# Patient Record
Sex: Female | Born: 1995 | Race: Black or African American | Hispanic: No | Marital: Single | Smoking: Current every day smoker
Health system: Southern US, Community
[De-identification: ages and names within clinical notes are randomized; demographics above are authoritative.]

## PROBLEM LIST (undated history)

## (undated) ENCOUNTER — Inpatient Hospital Stay (HOSPITAL_COMMUNITY): Payer: Self-pay

## (undated) DIAGNOSIS — J45909 Unspecified asthma, uncomplicated: Secondary | ICD-10-CM

## (undated) DIAGNOSIS — F913 Oppositional defiant disorder: Secondary | ICD-10-CM

## (undated) DIAGNOSIS — H539 Unspecified visual disturbance: Secondary | ICD-10-CM

## (undated) DIAGNOSIS — F319 Bipolar disorder, unspecified: Secondary | ICD-10-CM

## (undated) DIAGNOSIS — F909 Attention-deficit hyperactivity disorder, unspecified type: Secondary | ICD-10-CM

## (undated) HISTORY — PX: NO PAST SURGERIES: SHX2092

## (undated) HISTORY — DX: Bipolar disorder, unspecified: F31.9

## (undated) HISTORY — DX: Attention-deficit hyperactivity disorder, unspecified type: F90.9

## (undated) HISTORY — PX: WRIST SURGERY: SHX841

## (undated) HISTORY — DX: Oppositional defiant disorder: F91.3

---
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
## 2016-01-29 ENCOUNTER — Encounter: Payer: Self-pay | Admitting: Certified Nurse Midwife

## 2016-01-29 ENCOUNTER — Ambulatory Visit (INDEPENDENT_AMBULATORY_CARE_PROVIDER_SITE_OTHER): Payer: Medicaid Other | Admitting: Certified Nurse Midwife

## 2016-01-29 ENCOUNTER — Encounter: Payer: Self-pay | Admitting: *Deleted

## 2016-01-29 VITALS — BP 115/73 | HR 86

## 2016-01-29 DIAGNOSIS — Z202 Contact with and (suspected) exposure to infections with a predominantly sexual mode of transmission: Secondary | ICD-10-CM | POA: Diagnosis not present

## 2016-01-29 DIAGNOSIS — Z7251 High risk heterosexual behavior: Secondary | ICD-10-CM

## 2016-01-29 DIAGNOSIS — Z113 Encounter for screening for infections with a predominantly sexual mode of transmission: Secondary | ICD-10-CM

## 2016-01-29 MED ORDER — AZITHROMYCIN 250 MG PO TABS: ORAL_TABLET | ORAL | Status: DC

## 2016-01-29 NOTE — Progress Notes (Signed)
Patient ID: Andrea Whitehead, female   DOB: 08-13-1996, 20 y.o.   MRN: [id number]   Chief Complaint  Patient presents with  . Exposure to STD    pt partner recent +CH, pt would like testing.    HPI Andrea Whitehead is a 20 y.o. female.  Here for STD testing.  States that her partner was notified 2 days ago of +CH.  Denies any vaginal discharge/pain/spotting, or urinary symptoms.   Had [facility]on [facility] the health [facility].    HPI  Past Medical History  Diagnosis Date  . Oppositional defiant disorder   . Bipolar disorder (HCC)   . ADHD (attention deficit hyperactivity disorder)   . Asthma   . Vision abnormalities     Past Surgical History  Procedure Laterality Date  . No past surgeries      Family History  Problem Relation Age of Onset  . Hypertension Father   . ADD / ADHD Mother   . Bipolar disorder Mother   . ADD / ADHD Brother   . Bipolar disorder Maternal Grandmother   . Diabetes Maternal Aunt     Social History Social History  Substance Use Topics  . Smoking status: Current Every Day Smoker -- 0.50 packs/day    Types: Cigarettes  . Smokeless tobacco: Never Used  . Alcohol Use: No    No Known Allergies  Current Outpatient Prescriptions  Medication Sig Dispense Refill  . azithromycin (ZITHROMAX) 250 MG tablet Take 4 tablets all together now. 4 tablet 0  . etonogestrel (NEXPLANON) 68 MG IMPL implant 1 each by Subdermal route once.     No current facility-administered medications for this visit.    Review of Systems Review of Systems Constitutional: negative for fatigue and weight loss Respiratory: negative for cough and wheezing Cardiovascular: negative for chest pain, fatigue and palpitations Gastrointestinal: negative for abdominal pain and change in bowel habits Genitourinary:negative Integument/breast: negative for nipple discharge Musculoskeletal:negative for myalgias Neurological: negative for gait problems and tremors Behavioral/Psych: negative for  abusive relationship, depression Endocrine: negative for temperature intolerance     Blood pressure 115/73, pulse 86, not currently breastfeeding.  Physical Exam Physical Exam General:   alert  Skin:   no rash or abnormalities  Lungs:   clear to auscultation bilaterally  Heart:   regular rate and rhythm, S1, S2 normal, no murmur, click, rub or gallop  Breasts:   deferred  Abdomen:  normal findings: no organomegaly, soft, non-tender and no hernia  Pelvis:  External genitalia: normal general appearance Urinary system: urethral meatus normal and bladder without fullness, nontender Vaginal: normal without tenderness, induration or masses Cervix: no CMT Adnexa: normal bimanual exam Uterus: anteverted and non-tender, normal size    50% of 15 min visit spent on counseling and coordination of care.   Data Reviewed Previous medical hx, meds, labs  Assessment     Recent exposure to chlamydia STD testing  High risk sexual behaviors    Plan    Orders Placed This Encounter  Procedures  . NuSwab Vaginitis Plus (VG+)  . HIV antibody (with reflex)  . Hepatitis B surface antigen  . RPR  . Hepatitis C antibody   Meds ordered this encounter  Medications  . azithromycin (ZITHROMAX) 250 MG tablet    Sig: Take 4 tablets all together now.    Dispense:  4 tablet    Refill:  0    Need to obtain previous records for nexplanon placement Follow up as needed or for annual exam.

## 2016-01-30 LAB — HEPATITIS B SURFACE ANTIGEN: Hepatitis B Surface Ag: NEGATIVE

## 2016-01-30 LAB — HIV ANTIBODY (ROUTINE TESTING W REFLEX): HIV SCREEN 4TH GENERATION: NONREACTIVE

## 2016-01-30 LAB — RPR: RPR: NONREACTIVE

## 2016-01-30 LAB — HEPATITIS C ANTIBODY

## 2016-01-31 ENCOUNTER — Other Ambulatory Visit: Payer: Self-pay | Admitting: Certified Nurse Midwife

## 2016-02-01 LAB — NUSWAB VAGINITIS PLUS (VG+)
CHLAMYDIA TRACHOMATIS, NAA: POSITIVE — AB
Candida albicans, NAA: NEGATIVE
Candida glabrata, NAA: NEGATIVE
NEISSERIA GONORRHOEAE, NAA: NEGATIVE
Trich vag by NAA: NEGATIVE

## 2016-02-06 ENCOUNTER — Ambulatory Visit: Payer: Medicaid Other | Admitting: Certified Nurse Midwife

## 2016-02-14 ENCOUNTER — Other Ambulatory Visit: Payer: Self-pay | Admitting: Certified Nurse Midwife

## 2016-02-14 ENCOUNTER — Ambulatory Visit: Payer: Medicaid Other

## 2016-05-06 ENCOUNTER — Ambulatory Visit (INDEPENDENT_AMBULATORY_CARE_PROVIDER_SITE_OTHER): Payer: Self-pay | Admitting: General Practice

## 2016-05-06 DIAGNOSIS — R110 Nausea: Secondary | ICD-10-CM

## 2016-05-06 DIAGNOSIS — Z3202 Encounter for pregnancy test, result negative: Secondary | ICD-10-CM

## 2016-05-06 DIAGNOSIS — R112 Nausea with vomiting, unspecified: Secondary | ICD-10-CM

## 2016-05-06 LAB — POCT PREGNANCY, URINE: Preg Test, Ur: NEGATIVE

## 2016-05-06 NOTE — Progress Notes (Signed)
Patient here for upt today. upt negative. Denies positive home test. Patient states she has had abdominal pain and has been nauseous and throwing up for a couple weeks now. Recommended to patient she contact her PCP. Patient verbalized understanding and wanted to know if we could take her nexplanon out. Told patient we could but she would need to make an appt for that. Patient verbalized understanding & had no questions

## 2016-06-03 ENCOUNTER — Encounter: Payer: Self-pay | Admitting: *Deleted

## 2016-07-01 ENCOUNTER — Encounter (HOSPITAL_COMMUNITY): Payer: Self-pay | Admitting: *Deleted

## 2016-07-01 ENCOUNTER — Emergency Department (HOSPITAL_COMMUNITY): Payer: Medicaid Other

## 2016-07-01 ENCOUNTER — Emergency Department (HOSPITAL_COMMUNITY)
Admission: EM | Admit: 2016-07-01 | Discharge: 2016-07-01 | Disposition: A | Discharge status: Home / Self Care | Payer: Medicaid Other | Attending: Emergency Medicine | Admitting: Emergency Medicine

## 2016-07-01 DIAGNOSIS — Z23 Encounter for immunization: Secondary | ICD-10-CM | POA: Diagnosis not present

## 2016-07-01 DIAGNOSIS — J45909 Unspecified asthma, uncomplicated: Secondary | ICD-10-CM | POA: Insufficient documentation

## 2016-07-01 DIAGNOSIS — F17210 Nicotine dependence, cigarettes, uncomplicated: Secondary | ICD-10-CM | POA: Diagnosis not present

## 2016-07-01 DIAGNOSIS — T148XXA Other injury of unspecified body region, initial encounter: Secondary | ICD-10-CM

## 2016-07-01 DIAGNOSIS — Y999 Unspecified external cause status: Secondary | ICD-10-CM | POA: Diagnosis not present

## 2016-07-01 DIAGNOSIS — M25512 Pain in left shoulder: Secondary | ICD-10-CM

## 2016-07-01 DIAGNOSIS — S40212A Abrasion of left shoulder, initial encounter: Secondary | ICD-10-CM | POA: Diagnosis not present

## 2016-07-01 DIAGNOSIS — S4992XA Unspecified injury of left shoulder and upper arm, initial encounter: Secondary | ICD-10-CM | POA: Diagnosis present

## 2016-07-01 DIAGNOSIS — Y939 Activity, unspecified: Secondary | ICD-10-CM | POA: Insufficient documentation

## 2016-07-01 DIAGNOSIS — Y92410 Unspecified street and highway as the place of occurrence of the external cause: Secondary | ICD-10-CM | POA: Diagnosis not present

## 2016-07-01 MED ORDER — OXYCODONE-ACETAMINOPHEN 5-325 MG PO TABS
ORAL_TABLET | ORAL | Status: AC
  Filled 2016-07-01: qty 1

## 2016-07-01 MED ORDER — OXYCODONE-ACETAMINOPHEN 5-325 MG PO TABS
1.0000 | ORAL_TABLET | ORAL | Status: DC | PRN
  Administered 2016-07-01: 1 via ORAL

## 2016-07-01 MED ORDER — TETANUS-DIPHTH-ACELL PERTUSSIS 5-2.5-18.5 LF-MCG/0.5 IM SUSP
0.5000 mL | Freq: Once | INTRAMUSCULAR | Status: AC
  Administered 2016-07-01: 0.5 mL via INTRAMUSCULAR
  Filled 2016-07-01: qty 0.5

## 2016-07-01 NOTE — ED Notes (Signed)
Given e-visit coupon
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
## 2016-07-01 NOTE — Discharge Instructions (Signed)
Wear shoulder sling for no more than 3 days, then begin performing gentle range of motion exercises. Ice your shoulder throughout the day, using an ice pack for 20 minutes at a time every hour. Alternate between tylenol and motrin for pain relief. Call orthopedic follow up today or tomorrow to schedule followup appointment for recheck of ongoing shoulder pain in 1-2 weeks that can be canceled with a 24-48 hour notice if complete resolution of pain. Return to the ER for changes or worsening symptoms.
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
## 2016-07-06 IMAGING — US US OB COMP +14 WK
2 series · 12 of 28 positions shown · non-contrast
Comparison: none

[Series 1: us ob comp +14 wk · 11 of 72 slices shown (1 of 2)]
[im 3/72]
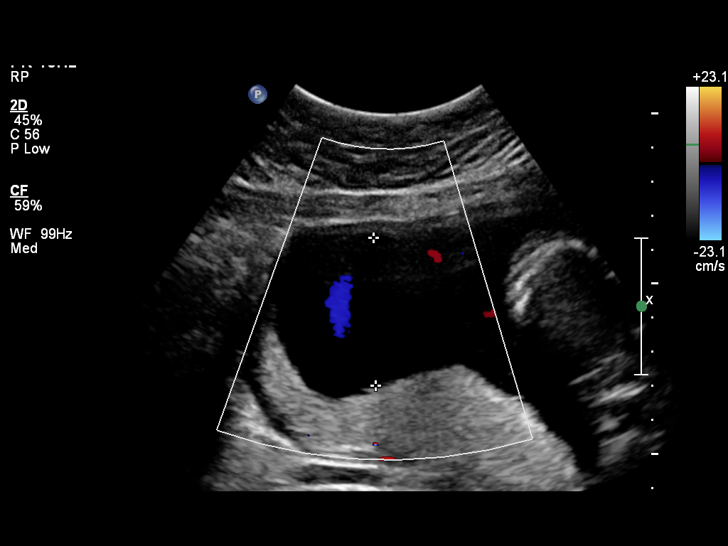
[im 9/72]
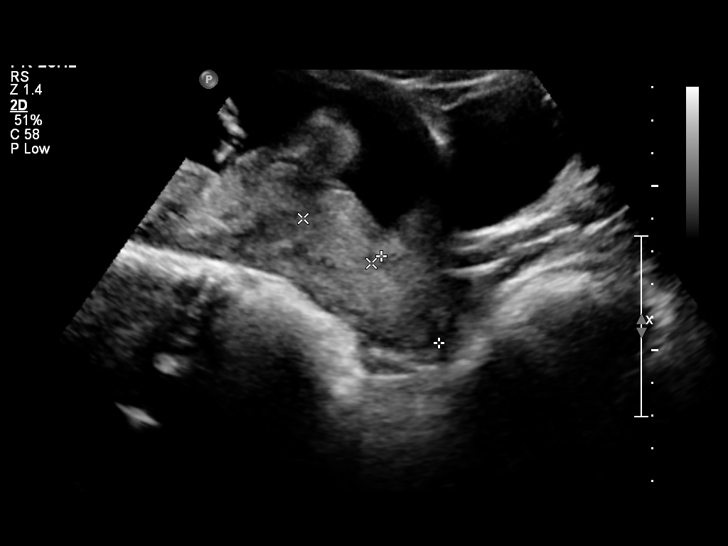
[im 15/72]
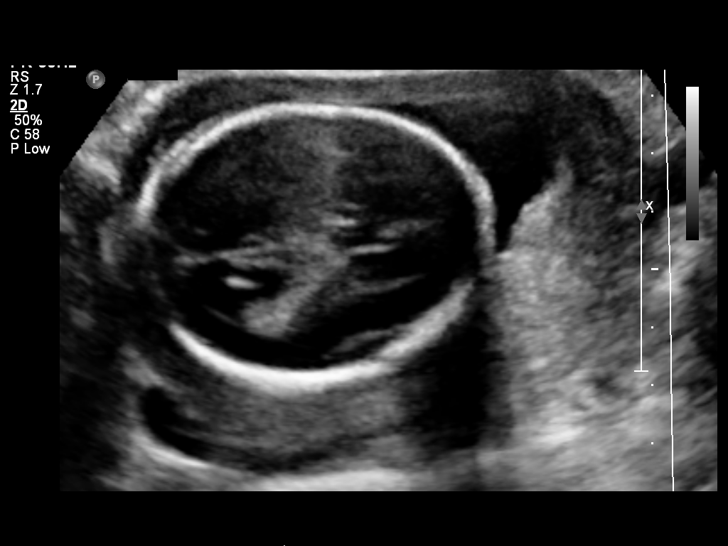
[im 23/72]
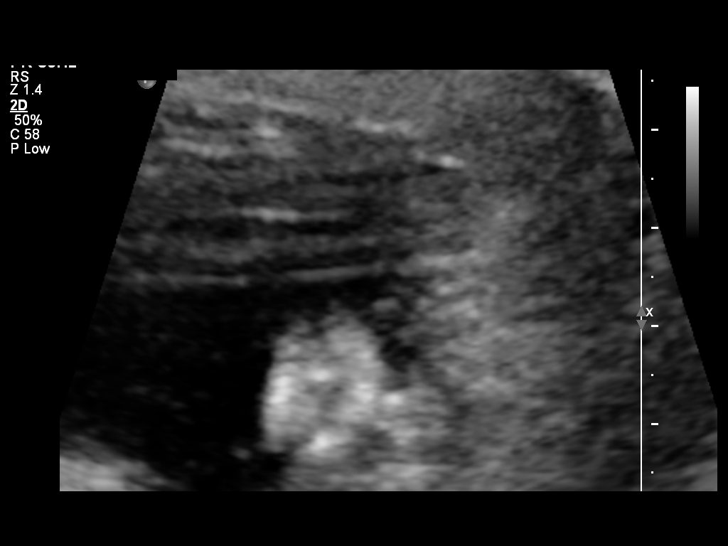
[im 29/72]
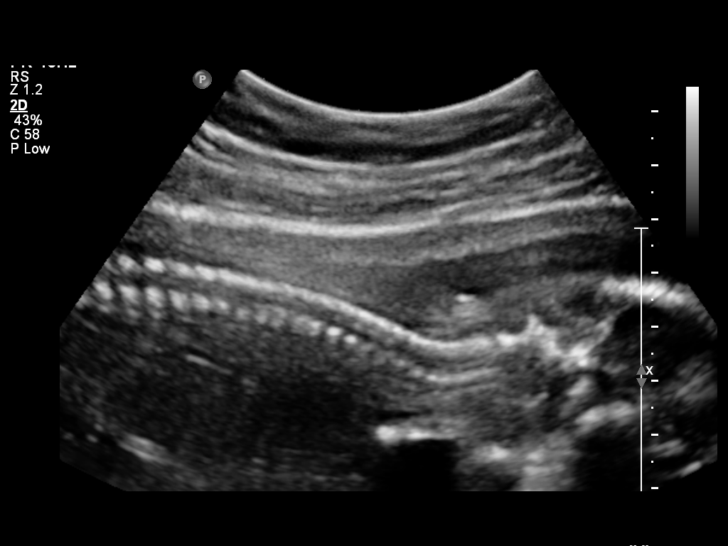
[im 35/72]
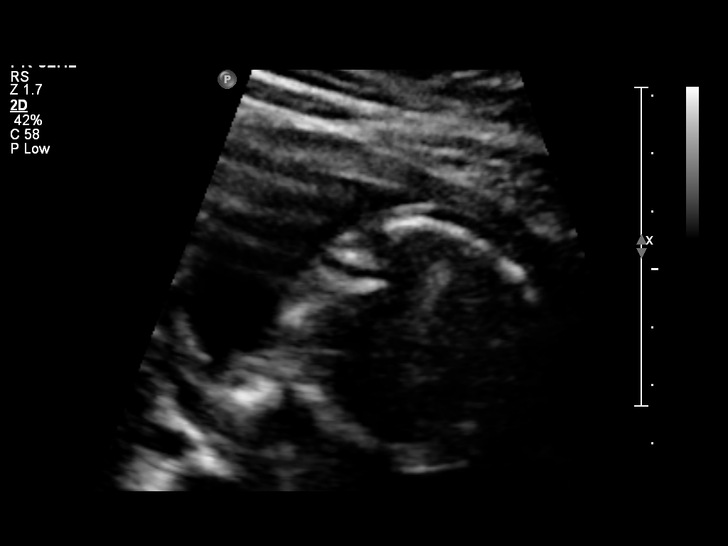
[im 43/72]
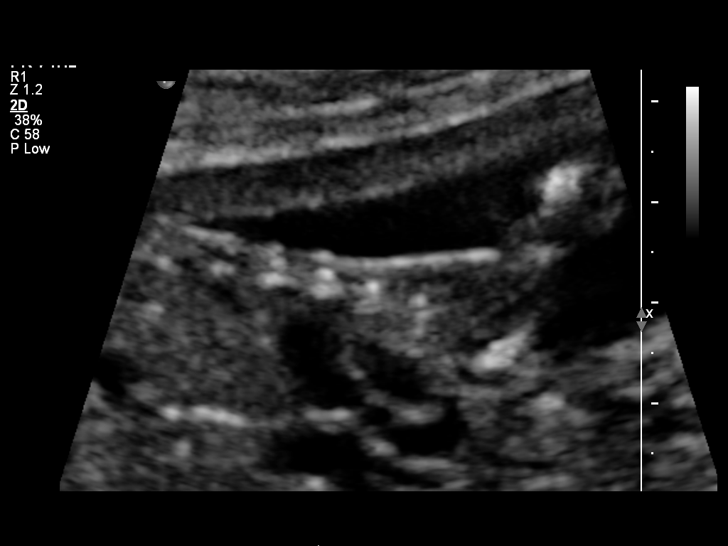
[im 49/72]
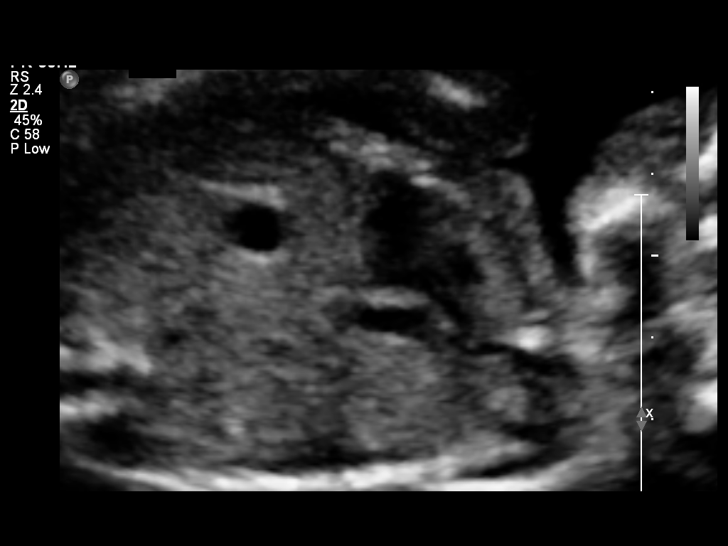
[im 54/72]
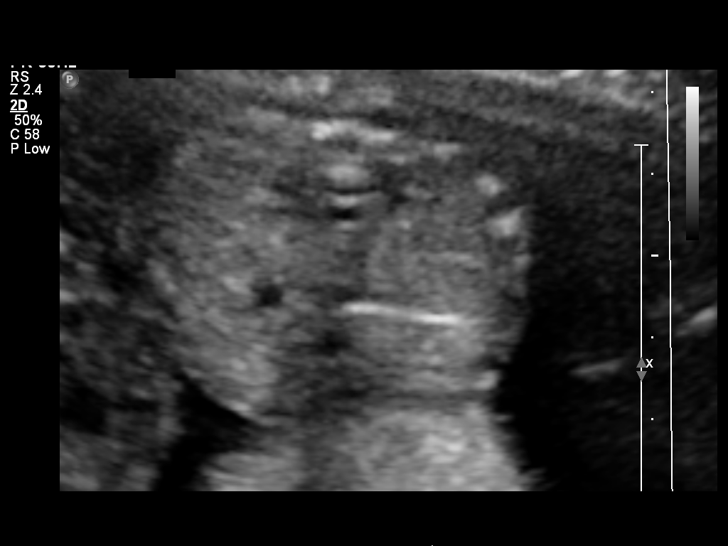
[im 63/72]
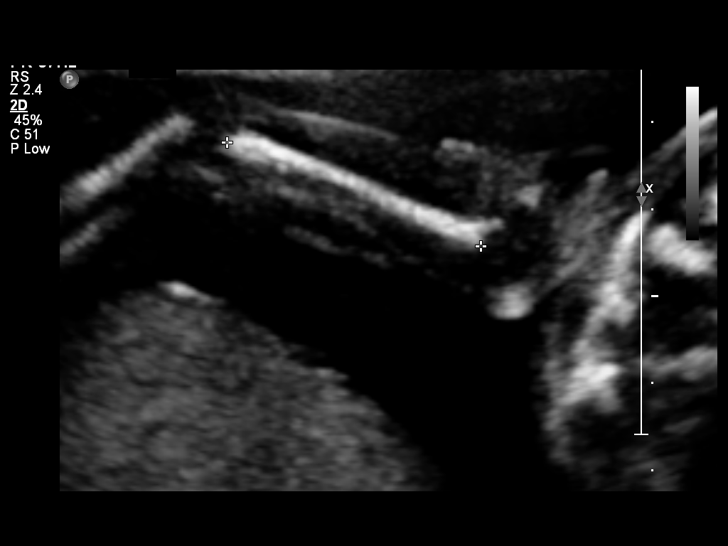
[im 69/72]
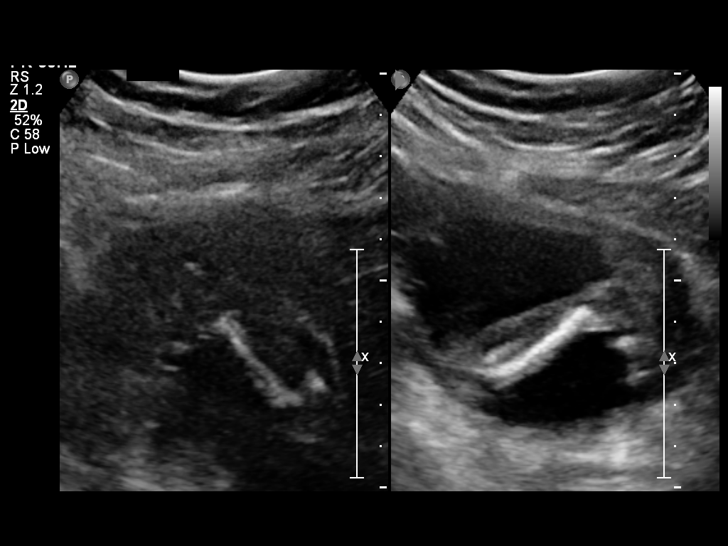

[Series 1: us ob comp +14 wk · 1 of 7 slices shown (2 of 2)]
[im 1/7]
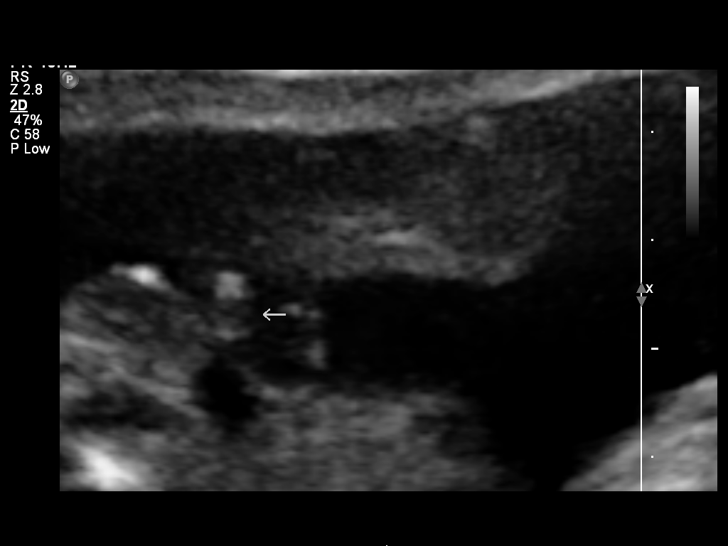

[12 of 28 positions shown; findings below may reference images not displayed]

OBSTETRICS REPORT
                      (Signed Final 05/21/2014 [DATE])

Service(s) Provided

 US OB COMP + 14 WK                                    76805.1
Indications

 Basic anatomic survey
Fetal Evaluation

 Num Of Fetuses:    1
 Fetal Heart Rate:  144                          bpm
 Cardiac Activity:  Observed
 Presentation:      Cephalic
 Placenta:          Posterior, above cervical
                    os
 P. Cord            Visualized, central
 Insertion:

 Amniotic Fluid
 AFI FV:      Subjectively within normal limits
                                             Larg Pckt:     4.3  cm
Biometry

 BPD:     45.6  mm     G. Age:  19w 5d                CI:        72.46   70 - 86
                                                      FL/HC:      18.3   16.8 -

 HC:     170.4  mm     G. Age:  19w 4d       46  %    HC/AC:      1.15   1.09 -

 AC:     147.8  mm     G. Age:  20w 1d       61  %    FL/BPD:
 FL:      31.1  mm     G. Age:  19w 4d       46  %    FL/AC:      21.0   20 - 24
 HUM:     31.9  mm     G. Age:  20w 5d       81  %
 CER:     19.3  mm     G. Age:  18w 5d       26  %
 NFT:     4.24  mm

 Est. FW:     316  gm    0 lb 11 oz      52  %
Gestational Age

 U/S Today:     19w 5d                                        EDD:   10/10/14
 Best:          19w 4d     Det. By:  Early Ultrasound         EDD:   10/11/14
Anatomy

 Cranium:          Appears normal         Aortic Arch:      Appears normal
 Fetal Cavum:      Appears normal         Ductal Arch:      Appears normal
 Ventricles:       Appears normal         Diaphragm:        Appears normal
 Choroid Plexus:   Appears normal         Stomach:          Appears normal, left
                                                            sided
 Cerebellum:       Appears normal         Abdomen:          Appears normal
 Posterior Fossa:  Appears normal         Abdominal Wall:   Appears nml (cord
                                                            insert, abd wall)
 Nuchal Fold:      Appears normal         Cord Vessels:     Appears normal (3
                                                            vessel cord)
 Face:             Appears normal         Kidneys:          Appear normal
                   (orbits and profile)
 Lips:             Appears normal         Bladder:          Appears normal
 Heart:            Appears normal         Spine:            Appears normal
                   (4CH, axis, and
                   situs)
 RVOT:             Appears normal         Lower             Appears normal
                                          Extremities:
 LVOT:             Appears normal         Upper             Appears normal
                                          Extremities:

 Other:  Heels visualized. Nasal bone visualized. Technically difficult due to
         fetal position.
Targeted Anatomy

 Fetal Central Nervous System
 Lat. Ventricles:  7.7                    Cisterna Magna:
Cervix Uterus Adnexa

 Cervical Length:    3.19     cm

 Cervix:       Normal appearance by transabdominal scan.
 Left Ovary:    Within normal limits.
 Right Ovary:   Within normal limits.

 Adnexa:     No abnormality visualized.
Impression

 Single IUP at 19w 4d
 Normal fetal anatomic survey
 No markers associated with aneuploidy noted
 Posterior placenta without previa
 Normal amniotic fluid volume
Recommendations

 Follow-up ultrasounds as clinically indicated.

 questions or concerns.

## 2016-10-15 ENCOUNTER — Emergency Department (HOSPITAL_COMMUNITY)
Admission: EM | Admit: 2016-10-15 | Discharge: 2016-10-15 | Disposition: A | Discharge status: Home / Self Care | Payer: Medicaid Other | Attending: Emergency Medicine | Admitting: Emergency Medicine

## 2016-10-15 ENCOUNTER — Emergency Department (HOSPITAL_COMMUNITY): Payer: Medicaid Other

## 2016-10-15 DIAGNOSIS — R69 Illness, unspecified: Secondary | ICD-10-CM

## 2016-10-15 DIAGNOSIS — R200 Anesthesia of skin: Secondary | ICD-10-CM

## 2016-10-15 DIAGNOSIS — J45909 Unspecified asthma, uncomplicated: Secondary | ICD-10-CM | POA: Insufficient documentation

## 2016-10-15 DIAGNOSIS — F17210 Nicotine dependence, cigarettes, uncomplicated: Secondary | ICD-10-CM | POA: Insufficient documentation

## 2016-10-15 DIAGNOSIS — F909 Attention-deficit hyperactivity disorder, unspecified type: Secondary | ICD-10-CM | POA: Insufficient documentation

## 2016-10-15 DIAGNOSIS — R05 Cough: Secondary | ICD-10-CM | POA: Diagnosis present

## 2016-10-15 DIAGNOSIS — J111 Influenza due to unidentified influenza virus with other respiratory manifestations: Secondary | ICD-10-CM

## 2016-10-15 LAB — RAPID STREP SCREEN (MED CTR MEBANE ONLY): Streptococcus, Group A Screen (Direct): NEGATIVE

## 2016-10-15 LAB — INFLUENZA PANEL BY PCR (TYPE A & B)
INFLAPCR: POSITIVE — AB
INFLBPCR: NEGATIVE

## 2016-10-15 MED ORDER — IPRATROPIUM-ALBUTEROL 0.5-2.5 (3) MG/3ML IN SOLN
3.0000 mL | Freq: Once | RESPIRATORY_TRACT | Status: AC
  Administered 2016-10-15: 3 mL via RESPIRATORY_TRACT
  Filled 2016-10-15: qty 3

## 2016-10-15 MED ORDER — IBUPROFEN 800 MG PO TABS
800.0000 mg | ORAL_TABLET | Freq: Once | ORAL | Status: AC
  Administered 2016-10-15: 800 mg via ORAL
  Filled 2016-10-15: qty 1

## 2016-10-15 NOTE — ED Notes (Signed)
Pt not in room or bathroom. Pt seen walking out front doors to lobby.

## 2016-10-15 NOTE — ED Triage Notes (Signed)
Per EMS. Pt reports productive cough, generalized body aches, and fever of 102F for the past day. Has not taken any OTC medications.

## 2016-10-15 NOTE — ED Provider Notes (Signed)
[facility]-EMERGENCY DEPT Provider Note   CSN: [id number] Arrival date & time: 10/15/16  1130  By signing my name below, I, Majel Homer, attest that this documentation has been prepared under the direction and in the presence of [facility], PA-C. Electronically Signed: Majel Homer, Scribe. 10/15/2016. 11:53 AM.  History   Chief Complaint Chief Complaint  Patient presents with  . Cough  . Generalized Body Aches   The history is provided by the patient. No language interpreter was used.   HPI Comments: Andrea Whitehead is a 21 y.o. female with PMHx of asthma, brought in by EMS to the Emergency Department complaining of gradually worsening, fever, generalized body aches, and productive cough that began yesterday.  Mild SOB, nasal congestion.  Pt reports intermittent, episodes of bilateral leg numbness that also began yesterday in which she "cannot not feel her legs." She states she awoke from sleep this morning with a similar sensation and notes she had 1 episode of bowel incontinence in bed, diarrhea in nature. She reports these episodes last for ~30 minutes before spontaneously improving. Pt states associated intermittent shortness of breath, sore throat, "hot flashes," nausea, vomiting secondary to her cough, and diarrhea. She notes she has been around her boyfriend and son who recently had a "cold." She states she has taken NyQuil for her symptoms with no relief and reports she did not receive the influenza vaccination this year. Pt denies ear pain, dysuria, urinary frequency, back pain, recent long travel or hospitalization, IV drug use, and possibility of being pregnant. She states she is currently taking Nexplanon contraception which "stops her period."   Past Medical History:  Diagnosis Date  . ADHD (attention deficit hyperactivity disorder)   . Asthma   . Bipolar disorder (HCC)   . Oppositional defiant disorder   . Vision abnormalities     Patient Active Problem List   Diagnosis Date Noted  .  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 Episodic mood disorder (HCC) 12/07/2012  . ODD (oppositional defiant disorder) 12/07/2012  . ADHD (attention deficit hyperactivity disorder) 05/27/2012    Past Surgical History:  Procedure Laterality Date  . NO PAST SURGERIES      OB History    Gravida Para Term Preterm AB Living   1 1 1     1    SAB TAB Ectopic Multiple Live Births         0 1     Home Medications    Prior to Admission medications   Medication Sig Start Date End Date Taking? Authorizing Provider  etonogestrel (NEXPLANON) 68 MG IMPL implant 1 each by Subdermal route once.   Yes Historical Provider, MD  azithromycin (ZITHROMAX) 250 MG tablet Take 4 tablets all together now. Patient not taking: Reported on 10/15/2016 01/29/16   Roe Coombs, CNM    Family History Family History  Problem Relation Age of Onset  . Hypertension Father   . ADD / ADHD Mother   . Bipolar disorder Mother   . ADD / ADHD Brother   . Bipolar disorder Maternal Grandmother   . Diabetes Maternal Aunt     Social History Social History  Substance Use Topics  . Smoking status: Current Every Day Smoker    Packs/day: 0.50    Types: Cigarettes  . Smokeless tobacco: Never Used  . Alcohol use No     Allergies   Patient has no known allergies.   Review of Systems Review of Systems  HENT: Positive for sore  throat. Negative for ear pain.   Respiratory: Positive for cough and shortness of breath.   Gastrointestinal: Positive for diarrhea, nausea and vomiting.  Genitourinary: Negative for dysuria and frequency.  Musculoskeletal: Positive for myalgias. Negative for back pain.  Neurological: Positive for numbness.   Physical Exam Updated Vital Signs BP 118/75 (BP Location: Right Arm)   Pulse 118   Temp 102.7 F (39.3 C) (Oral)   Resp 20   SpO2 100%   Physical Exam  Constitutional: She appears well-developed and well-nourished. No distress.  HENT:    Head: Normocephalic and atraumatic.  Mouth/Throat: Oropharynx is clear and moist. No oropharyngeal exudate.  Eyes: Conjunctivae are normal.  Neck: Neck supple.  Cardiovascular: Normal rate and regular rhythm.   Pulmonary/Chest: Effort normal. No respiratory distress. She has no wheezes. She has no rales.  Coughing   Abdominal: Soft. She exhibits no distension and no mass. There is no tenderness. There is no rebound and no guarding.  Musculoskeletal:  Spine nontender, no crepitus, or stepoffs. Lower extremities:  Strength 5/5, sensation intact, distal pulses intact.     Neurological: She is alert.  Skin: She is not diaphoretic.  Nursing note and vitals reviewed.  ED Treatments / Results  Labs (all labs ordered are listed, but only abnormal results are displayed) Labs Reviewed  RAPID STREP SCREEN (NOT AT [facility])  CULTURE, GROUP A STREP [facility]  INFLUENZA PANEL BY PCR (TYPE A & B, H1N1)    EKG  EKG Interpretation None       Radiology Dg Chest 2 View  Result Date: 10/15/2016 CLINICAL DATA:  Cough, fever, body aches EXAM: CHEST  2 VIEW COMPARISON:  None. FINDINGS: No active infiltrate or effusion is seen. There is some peribronchial thickening which may indicate bronchitis. Mediastinal and hilar contours are unremarkable. The heart is within normal limits in size. No bony abnormality is seen. IMPRESSION: No pneumonia or effusion.  Cannot exclude bronchitis. Electronically Signed   By: Dwyane DeePaul  Barry M.D.   On: 10/15/2016 11:58    Procedures Procedures (including critical care time)  Medications Ordered in ED Medications  ibuprofen (ADVIL,MOTRIN) tablet 800 mg (800 mg Oral Given 10/15/16 1208)  ipratropium-albuterol (DUONEB) 0.5-2.5 (3) MG/3ML nebulizer solution 3 mL (3 mLs Nebulization Given 10/15/16 1321)    DIAGNOSTIC STUDIES:  Oxygen Saturation is 100% on RA, normal by my interpretation.    COORDINATION OF CARE:  11:45 AM Discussed treatment plan with pt at bedside and pt  agreed to plan.  Initial Impression / Assessment and Plan / ED Course  I have reviewed the triage vital signs and the nursing notes.  Pertinent labs & imaging results that were available during my care of the patient were reviewed by me and considered in my medical decision making (see chart for details).  Clinical Course    Febrile patient with cough and symptoms that are influenza-like.  She improved with motrin and nebs and felt better.  Oddly, pt c/o intermittent bilateral leg "numbness" in which she could not feel her legs.  This morning this occurred and she states this is why she did not realize she had diarrhea in the bed.  It occurred once while in the ED during which pt denies she could feel me touch her all the way up to her diaphragm  She could move her legs at the time and had 5/5 strength, and was complaining of needing to urinate and was wheeled to the bathroom.  The symptoms resolved again and she was able  to get up, chase her toddler around the halls, and exam was completely normal.  The etiology of this is unclear though she did tell me that the symptoms seemed to occur when her pain was worse.  She denies IVDU and denies any back pain.  Exam while not symptomatic was completely normal.  Unfortunately as I was treating and monitoring the patient and waiting for another episode to occur for further testing, she decided to leave the ED without telling staff.     Final Clinical Impressions(s) / ED Diagnoses   Final diagnoses:  Influenza-like illness  Lower extremity numbness    New Prescriptions Discharge Medication List as of 10/15/2016  2:04 PM       Trixie Dredge, PA-C 10/15/16 1541    Alvira Monday, MD 10/17/16 1202

## 2016-10-17 LAB — CULTURE, GROUP A STREP (THRC)

## 2016-10-28 ENCOUNTER — Inpatient Hospital Stay (HOSPITAL_COMMUNITY)
Admission: AD | Admit: 2016-10-28 | Discharge: 2016-10-28 | Disposition: A | Discharge status: Home / Self Care | Payer: Medicaid Other | Source: Ambulatory Visit | Attending: Obstetrics and Gynecology | Admitting: Obstetrics and Gynecology

## 2016-10-28 ENCOUNTER — Encounter (HOSPITAL_COMMUNITY): Payer: Self-pay | Admitting: *Deleted

## 2016-10-28 DIAGNOSIS — R1010 Upper abdominal pain, unspecified: Secondary | ICD-10-CM

## 2016-10-28 DIAGNOSIS — N644 Mastodynia: Secondary | ICD-10-CM | POA: Diagnosis not present

## 2016-10-28 DIAGNOSIS — R1030 Lower abdominal pain, unspecified: Secondary | ICD-10-CM | POA: Insufficient documentation

## 2016-10-28 DIAGNOSIS — Z3202 Encounter for pregnancy test, result negative: Secondary | ICD-10-CM | POA: Diagnosis not present

## 2016-10-28 DIAGNOSIS — Z975 Presence of (intrauterine) contraceptive device: Secondary | ICD-10-CM | POA: Diagnosis not present

## 2016-10-28 LAB — URINALYSIS, ROUTINE W REFLEX MICROSCOPIC
Bilirubin Urine: NEGATIVE
GLUCOSE, UA: NEGATIVE mg/dL
HGB URINE DIPSTICK: NEGATIVE
Ketones, ur: NEGATIVE mg/dL
Leukocytes, UA: NEGATIVE
Nitrite: NEGATIVE
PROTEIN: NEGATIVE mg/dL
Specific Gravity, Urine: 1.018 (ref 1.005–1.030)
pH: 6 (ref 5.0–8.0)

## 2016-10-28 LAB — POCT PREGNANCY, URINE: Preg Test, Ur: NEGATIVE

## 2016-10-28 MED ORDER — IBUPROFEN 600 MG PO TABS
600.0000 mg | ORAL_TABLET | Freq: Once | ORAL | Status: AC
  Administered 2016-10-28: 600 mg via ORAL
  Filled 2016-10-28: qty 1
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
## 2016-10-28 NOTE — MAU Note (Signed)
Pt left before signing.  

## 2016-10-28 NOTE — MAU Note (Signed)
Pt presents to MAU with complaints of lower back, lower abdominal pain and breast pain for a couple of days. Denies any vaginal bleeding or abnormal discharge.

## 2016-10-28 NOTE — MAU Provider Note (Signed)
S:  [name] is a 21 y.o. female G1P1001 presenting VIA EMS for pregnancy symptoms. She has the nexplanon in place. She is experiencing pregnancy symptoms such as lower abdominal cramping, and breast tenderness. She has not had a normal cycle in over 1 year.  She did not take a pregnancy test at home.   O:  GENERAL: Well-developed, well-nourished female in no acute distress.  LUNGS: Effort normal ABDOMEN: soft, non-tender  SKIN: Warm, dry and without erythema PSYCH: Normal mood and affect  Vitals:   10/28/16 1818  BP: 118/63  Pulse: 78  Resp: 18  Temp: 98.4 F (36.9 C)   MDM: Urine pregnancy test negative  UA pending Ibuprofen 600 mg PO  A:  1. Encounter for pregnancy test with result negative     P:  Patient walked out of MAU after Ibuprofen was given UA pending Unable to assess pain post ibuprofen. Discussed MAU use   Duane LopeJennifer I Sabiha Sura, NP 10/28/2016 6:40 PM

## 2018-08-25 ENCOUNTER — Emergency Department (HOSPITAL_COMMUNITY)
Admission: EM | Admit: 2018-08-25 | Discharge: 2018-08-25 | Disposition: A | Discharge status: Home / Self Care | Payer: Medicaid Other | Attending: Emergency Medicine | Admitting: Emergency Medicine

## 2018-08-25 ENCOUNTER — Encounter (HOSPITAL_COMMUNITY): Payer: Self-pay | Admitting: Emergency Medicine

## 2018-08-25 ENCOUNTER — Emergency Department (HOSPITAL_COMMUNITY): Payer: Medicaid Other

## 2018-08-25 DIAGNOSIS — Z5321 Procedure and treatment not carried out due to patient leaving prior to being seen by health care provider: Secondary | ICD-10-CM | POA: Insufficient documentation

## 2018-08-25 DIAGNOSIS — R0981 Nasal congestion: Secondary | ICD-10-CM | POA: Diagnosis not present

## 2018-08-25 NOTE — ED Triage Notes (Signed)
Pt reports cough/congestion X1 day. Pt states she has tried no OTC meds.

## 2018-08-25 NOTE — ED Notes (Signed)
Pt seen leaving the department shortly after being triaged.

## 2018-08-25 NOTE — ED Notes (Signed)
Pt called x3 to room in green zone no response.

## 2018-09-01 ENCOUNTER — Encounter (HOSPITAL_COMMUNITY): Payer: Self-pay | Admitting: Emergency Medicine

## 2018-09-01 ENCOUNTER — Emergency Department (HOSPITAL_COMMUNITY)
Admission: EM | Admit: 2018-09-01 | Discharge: 2018-09-01 | Disposition: A | Discharge status: Home / Self Care | Payer: Medicaid Other | Attending: Emergency Medicine | Admitting: Emergency Medicine

## 2018-09-01 DIAGNOSIS — J0100 Acute maxillary sinusitis, unspecified: Secondary | ICD-10-CM | POA: Diagnosis not present

## 2018-09-01 DIAGNOSIS — J45909 Unspecified asthma, uncomplicated: Secondary | ICD-10-CM | POA: Diagnosis not present

## 2018-09-01 DIAGNOSIS — F17210 Nicotine dependence, cigarettes, uncomplicated: Secondary | ICD-10-CM | POA: Insufficient documentation

## 2018-09-01 DIAGNOSIS — H9202 Otalgia, left ear: Secondary | ICD-10-CM

## 2018-09-01 DIAGNOSIS — F909 Attention-deficit hyperactivity disorder, unspecified type: Secondary | ICD-10-CM | POA: Diagnosis not present

## 2018-09-01 MED ORDER — FLUTICASONE PROPIONATE 50 MCG/ACT NA SUSP: 1.0000 | Freq: Every day | NASAL | 2 refills | Status: DC

## 2018-09-01 MED ORDER — AMOXICILLIN 500 MG PO CAPS
1000.0000 mg | ORAL_CAPSULE | Freq: Once | ORAL | Status: AC
  Administered 2018-09-01: 1000 mg via ORAL
  Filled 2018-09-01: qty 2

## 2018-09-01 MED ORDER — IBUPROFEN 600 MG PO TABS: 600.0000 mg | ORAL_TABLET | Freq: Four times a day (QID) | ORAL | 0 refills | Status: DC | PRN

## 2018-09-01 MED ORDER — IBUPROFEN 400 MG PO TABS
600.0000 mg | ORAL_TABLET | Freq: Once | ORAL | Status: AC
  Administered 2018-09-01: 600 mg via ORAL
  Filled 2018-09-01: qty 1

## 2018-09-01 MED ORDER — AMOXICILLIN 500 MG PO CAPS: 1000.0000 mg | ORAL_CAPSULE | Freq: Two times a day (BID) | ORAL | 0 refills | Status: DC

## 2018-09-01 NOTE — ED Triage Notes (Signed)
Pt presents to ER from home with [facility] for L ear ache for couple days; denies fever, inner ear red, tympanic membranes grey

## 2018-09-01 NOTE — ED Notes (Signed)
Pt asked this RN for a ride home, pt currently holding phone which she has been using to talk to people since shes been here; I told her to attempt to get a ride on her own; pt asked if one of the officers standing at the nurses station could give her a ride, they said her address was outside of their district; pt ambulatory to lobby with steady gait noted

## 2018-09-01 NOTE — ED Provider Notes (Signed)
[facility] EMERGENCY DEPARTMENT Provider Note   CSN: [id number] Arrival date & time: 09/01/18  0051     History   Chief Complaint Chief Complaint  Patient presents with  . Otalgia    HPI Andrea Whitehead is a 22 y.o. female.  Patient to ED via EMS for left ear pain that started several hours ago. She has not taken anything for the pain prior to arrival. No fever. She reports some nasal congestion for several weeks without fever. No sore throat, nausea, cough.  The history is provided by the patient. No language interpreter was used.  Otalgia  Pertinent negatives include no sore throat and no cough.    Past Medical History:  Diagnosis Date  . ADHD (attention deficit hyperactivity disorder)   . Asthma   . Bipolar disorder (HCC)   . Oppositional defiant disorder   . Vision abnormalities     Patient Active Problem List   Diagnosis Date Noted  . 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 Episodic mood disorder (HCC) 12/07/2012  . ODD (oppositional defiant disorder) 12/07/2012  . ADHD (attention deficit hyperactivity disorder) 05/27/2012    Past Surgical History:  Procedure Laterality Date  . NO PAST SURGERIES    . WRIST SURGERY Right      OB History    Gravida  1   Para  1   Term  1   Preterm      AB      Living  1     SAB      TAB      Ectopic      Multiple  0   Live Births  1            Home Medications    Prior to Admission medications   Medication Sig Start Date End Date Taking? Authorizing Provider  etonogestrel (NEXPLANON) 68 MG IMPL implant 1 each by Subdermal route once.    [provider]    Family History Family History  Problem Relation Age of Onset  . Hypertension Father   . ADD / ADHD Mother   . Bipolar disorder Mother   . ADD / ADHD Brother   . Bipolar disorder Maternal Grandmother   . Diabetes Maternal Aunt     Social History Social History   Tobacco  Use  . Smoking status: Current Every Day Smoker    Packs/day: 0.50    Types: Cigarettes  . Smokeless tobacco: Never Used  Substance Use Topics  . Alcohol use: No  . Drug use: No     Allergies   Patient has no known allergies.   Review of Systems Review of Systems  Constitutional: Negative for chills and fever.  HENT: Positive for congestion and ear pain. Negative for sore throat.   Eyes: Negative for pain and discharge.  Respiratory: Negative for cough.   Gastrointestinal: Negative for nausea.     Physical Exam Updated Vital Signs BP 132/77 (BP Location: Right Arm)   Pulse 75   Temp 98.1 F (36.7 C) (Oral)   Resp 18   Ht 5\' 5"  (1.651 m)   Wt 81.6 kg   LMP 08/13/2018   SpO2 98%   BMI 29.95 kg/m   Physical Exam  Constitutional: She appears well-developed and well-nourished.  HENT:  Head: Normocephalic.  Right Ear: Tympanic membrane normal.  Left Ear: Tympanic membrane is injected.  No middle ear effusion.  Nose: Mucosal edema present. Left sinus exhibits maxillary sinus  tenderness.  Mouth/Throat: Uvula is midline and oropharynx is clear and moist.  Eyes: Conjunctivae are normal.  Neck: Normal range of motion. Neck supple.  Cardiovascular: Normal rate and regular rhythm.  Pulmonary/Chest: Effort normal and breath sounds normal. She has no wheezes. She has no rales.  Abdominal: Soft. Bowel sounds are normal. There is no tenderness. There is no rebound and no guarding.  Musculoskeletal: Normal range of motion.  Lymphadenopathy:    She has no cervical adenopathy.  Neurological: She is alert.  Skin: Skin is warm and dry. No rash noted.  Psychiatric: She has a normal mood and affect.     ED Treatments / Results  Labs (all labs ordered are listed, but only abnormal results are displayed) Labs Reviewed - No data to display  EKG None  Radiology No results found.  Procedures Procedures (including critical care time)  Medications Ordered in  ED Medications - No data to display   Initial Impression / Assessment and Plan / ED Course  I have reviewed the triage vital signs and the nursing notes.  Pertinent labs & imaging results that were available during my care of the patient were reviewed by me and considered in my medical decision making (see chart for details).     Patient to ED with intense left ear pain. No fever, trauma, drainage or bleeding.   She has left sided nasal mucosa and sinus tenderness. C/O congestion ongoing for weeks. Suspect ear pain is related to sinus pressure and congestion as there is no evidence of otitis.   Will treat with Amoxil, Flonase, ibuprofen and recommend PCP follow up.  Final Clinical Impressions(s) / ED Diagnoses   Final diagnoses:  None   1. Sinusitis 2. Left otalgia  ED Discharge Orders    None       Danne HarborUpstill, Maxton Noreen, PA-C 09/01/18 0121    Dione BoozeGlick, David, MD 09/01/18 [phone]

## 2018-11-14 ENCOUNTER — Ambulatory Visit (INDEPENDENT_AMBULATORY_CARE_PROVIDER_SITE_OTHER): Payer: Medicaid Other

## 2018-11-14 DIAGNOSIS — Z3202 Encounter for pregnancy test, result negative: Secondary | ICD-10-CM

## 2018-11-14 NOTE — Progress Notes (Signed)
I have reviewed the chart and agree with nursing staff's documentation of this patient's encounter.   Thressa Sheller DNP, CNM  11/14/18  2:13 PM

## 2018-11-14 NOTE — Progress Notes (Signed)
Pt her for UPT-Negative. Pt verbalized understanding

## 2018-11-23 ENCOUNTER — Emergency Department (HOSPITAL_COMMUNITY)
Admission: EM | Admit: 2018-11-23 | Discharge: 2018-11-24 | Disposition: A | Discharge status: Home / Self Care | Payer: Medicaid Other | Attending: Emergency Medicine | Admitting: Emergency Medicine

## 2018-11-23 DIAGNOSIS — F17210 Nicotine dependence, cigarettes, uncomplicated: Secondary | ICD-10-CM | POA: Diagnosis not present

## 2018-11-23 DIAGNOSIS — Z79899 Other long term (current) drug therapy: Secondary | ICD-10-CM | POA: Diagnosis not present

## 2018-11-23 DIAGNOSIS — F909 Attention-deficit hyperactivity disorder, unspecified type: Secondary | ICD-10-CM | POA: Insufficient documentation

## 2018-11-23 DIAGNOSIS — J101 Influenza due to other identified influenza virus with other respiratory manifestations: Secondary | ICD-10-CM | POA: Diagnosis not present

## 2018-11-23 DIAGNOSIS — R05 Cough: Secondary | ICD-10-CM | POA: Diagnosis present

## 2018-11-23 LAB — INFLUENZA PANEL BY PCR (TYPE A & B)
INFLAPCR: POSITIVE — AB
INFLBPCR: NEGATIVE

## 2018-11-23 MED ORDER — OSELTAMIVIR PHOSPHATE 75 MG PO CAPS: 75.0000 mg | ORAL_CAPSULE | Freq: Two times a day (BID) | ORAL | 0 refills | Status: DC

## 2018-11-23 MED ORDER — ACETAMINOPHEN 325 MG PO TABS
650.0000 mg | ORAL_TABLET | Freq: Once | ORAL | Status: AC | PRN
  Administered 2018-11-23: 650 mg via ORAL
  Filled 2018-11-23: qty 2

## 2018-11-23 MED ORDER — IBUPROFEN 400 MG PO TABS
600.0000 mg | ORAL_TABLET | Freq: Once | ORAL | Status: AC
  Administered 2018-11-23: 600 mg via ORAL
  Filled 2018-11-23: qty 1

## 2018-11-23 MED ORDER — OSELTAMIVIR PHOSPHATE 75 MG PO CAPS
75.0000 mg | ORAL_CAPSULE | Freq: Once | ORAL | Status: AC
  Administered 2018-11-23: 75 mg via ORAL
  Filled 2018-11-23: qty 1

## 2018-11-23 NOTE — Discharge Instructions (Addendum)
Take Tylenol and/or ibuprofen for aches and fever. Drink plenty of water and juice, eat when you feel like eating. Take Tamiflu as directed. See your doctor if symptoms persist.

## 2018-11-23 NOTE — ED Triage Notes (Signed)
Pt reports flu like S/S X1 day. Cough, HA, fever/chills, body aches.

## 2018-11-23 NOTE — ED Provider Notes (Signed)
[facility]   CSN: [id number] Arrival date & time: 11/23/18  2011     History   Chief Complaint Chief Complaint  Patient presents with  . Flu like S/S    HPI Andrea Whitehead is a 23 y.o. female.  Patient to ED with 24 hours of generalized body aches, congestion, rhinorrhea, cough and fever. Boyfriend with similar symptoms. She reports headache that is bilateral and frontal. History of same with bacterial sinusitis. No vomiting or diarrhea. No neck stiffness or rash.  The history is provided by the patient. No language interpreter was used.    Past Medical History:  Diagnosis Date  . ADHD (attention deficit hyperactivity disorder)   . Asthma   . Bipolar disorder (HCC)   . Oppositional defiant disorder   . Vision abnormalities     Patient Active Problem List   Diagnosis Date Noted  . 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 Episodic mood disorder (HCC) 12/07/2012  . ODD (oppositional defiant disorder) 12/07/2012  . ADHD (attention deficit hyperactivity disorder) 05/27/2012    Past Surgical History:  Procedure Laterality Date  . NO PAST SURGERIES    . WRIST SURGERY Right      OB History    Gravida  1   Para  1   Term  1   Preterm      AB      Living  1     SAB      TAB      Ectopic      Multiple  0   Live Births  1            Home Medications    Prior to Admission medications   Medication Sig Start Date End Date Taking? Authorizing Provider  amoxicillin (AMOXIL) 500 MG capsule Take 2 capsules (1,000 mg total) by mouth 2 (two) times daily. 09/01/18   Elpidio AnisUpstill, Arin Peral, PA-C  etonogestrel (NEXPLANON) 68 MG IMPL implant 1 each by Subdermal route once.    [provider]  fluticasone (FLONASE) 50 MCG/ACT nasal spray Place 1 spray into both nostrils daily. 09/01/18   Elpidio AnisUpstill, Hakiem Malizia, PA-C  ibuprofen (ADVIL,MOTRIN) 600 MG tablet Take 1 tablet (600 mg  total) by mouth every 6 (six) hours as needed. 09/01/18   Elpidio AnisUpstill, Miela Desjardin, PA-C    Family History Family History  Problem Relation Age of Onset  . Hypertension Father   . ADD / ADHD Mother   . Bipolar disorder Mother   . ADD / ADHD Brother   . Bipolar disorder Maternal Grandmother   . Diabetes Maternal Aunt     Social History Social History   Tobacco Use  . Smoking status: Current Every Day Smoker    Packs/day: 0.50    Types: Cigarettes  . Smokeless tobacco: Never Used  Substance Use Topics  . Alcohol use: No  . Drug use: No     Allergies   Patient has no known allergies.   Review of Systems Review of Systems  Constitutional: Positive for chills and fever.  HENT: Positive for congestion, rhinorrhea and sore throat.   Respiratory: Positive for cough.   Cardiovascular: Negative.   Gastrointestinal: Negative for nausea and vomiting.  Musculoskeletal: Positive for myalgias. Negative for neck stiffness.  Skin: Negative.  Negative for rash.  Neurological: Positive for headaches. Negative for syncope and light-headedness.     Physical Exam Updated Vital Signs BP 114/62 (BP Location: Right Arm)   Pulse Marland Kitchen(!)  114   Temp 99.2 F (37.3 C) (Oral)   Resp 18   SpO2 95%   Physical Exam Vitals signs and nursing note reviewed.  Constitutional:      Appearance: She is well-developed.  HENT:     Head: Normocephalic.     Nose: Mucosal edema and congestion present.     Right Sinus: Frontal sinus tenderness present.     Left Sinus: Frontal sinus tenderness present.     Mouth/Throat:     Pharynx: No oropharyngeal exudate.  Eyes:     Conjunctiva/sclera: Conjunctivae normal.  Neck:     Musculoskeletal: Normal range of motion and neck supple.  Cardiovascular:     Rate and Rhythm: Normal rate and regular rhythm.  Pulmonary:     Effort: Pulmonary effort is normal.     Breath sounds: Normal breath sounds. No wheezing, rhonchi or rales.  Abdominal:     General: Bowel sounds  are normal.     Palpations: Abdomen is soft.     Tenderness: There is no abdominal tenderness. There is no guarding or rebound.  Musculoskeletal: Normal range of motion.  Skin:    General: Skin is warm and dry.     Findings: No rash.  Neurological:     Mental Status: She is alert and oriented to person, place, and time.      ED Treatments / Results  Labs (all labs ordered are listed, but only abnormal results are displayed) Labs Reviewed  INFLUENZA PANEL BY PCR (TYPE A & B)    EKG None  Radiology No results found.  Procedures Procedures (including critical care time)  Medications Ordered in ED Medications  ibuprofen (ADVIL,MOTRIN) tablet 600 mg (has no administration in time range)  acetaminophen (TYLENOL) tablet 650 mg (650 mg Oral Given 11/23/18 2039)     Initial Impression / Assessment and Plan / ED Course  I have reviewed the triage vital signs and the nursing notes.  Pertinent labs & imaging results that were available during my care of the patient were reviewed by me and considered in my medical decision making (see chart for details).     Patient to ED with constellation of flu-like symptoms. She is febrile on arrival. Headache with sinus tenderness and history of bacterial sinusitis. Will test for flu. If positive will discuss Tamiflu with the patient and if negative will consider abx treatment for acute sinusitis. Favor influenza.  Patient is positive for influenza A. Tamiflu started in ED. Discussed possible side effects, importance of treating the fever, recommended Tylenol and ibuprofen, stressed the importance of drinking plenty of fluids.   Tachycardia here is resolved with PO fluids. She is nontoxic in appearance and felt appropriate for discharge home.   Final Clinical Impressions(s) / ED Diagnoses   Final diagnoses:  None   1. Influenza A  ED Discharge Orders    None       Elpidio Anis, Cordelia Poche 11/23/18 2345    Shaune Pollack,  MD 11/24/18 1126

## 2019-02-04 ENCOUNTER — Other Ambulatory Visit: Payer: Self-pay

## 2019-02-04 ENCOUNTER — Encounter (HOSPITAL_COMMUNITY): Payer: Self-pay

## 2019-02-04 ENCOUNTER — Emergency Department (HOSPITAL_COMMUNITY)
Admission: EM | Admit: 2019-02-04 | Discharge: 2019-02-04 | Disposition: A | Discharge status: Home / Self Care | Payer: Medicaid Other | Attending: Emergency Medicine | Admitting: Emergency Medicine

## 2019-02-04 DIAGNOSIS — N3000 Acute cystitis without hematuria: Secondary | ICD-10-CM | POA: Insufficient documentation

## 2019-02-04 DIAGNOSIS — F17210 Nicotine dependence, cigarettes, uncomplicated: Secondary | ICD-10-CM | POA: Diagnosis not present

## 2019-02-04 DIAGNOSIS — F319 Bipolar disorder, unspecified: Secondary | ICD-10-CM | POA: Insufficient documentation

## 2019-02-04 DIAGNOSIS — R1033 Periumbilical pain: Secondary | ICD-10-CM | POA: Insufficient documentation

## 2019-02-04 DIAGNOSIS — Z8709 Personal history of other diseases of the respiratory system: Secondary | ICD-10-CM | POA: Diagnosis not present

## 2019-02-04 DIAGNOSIS — R300 Dysuria: Secondary | ICD-10-CM | POA: Diagnosis present

## 2019-02-04 DIAGNOSIS — F909 Attention-deficit hyperactivity disorder, unspecified type: Secondary | ICD-10-CM | POA: Diagnosis not present

## 2019-02-04 LAB — URINALYSIS, ROUTINE W REFLEX MICROSCOPIC
Bacteria, UA: NONE SEEN
Bilirubin Urine: NEGATIVE
Glucose, UA: NEGATIVE mg/dL
Ketones, ur: NEGATIVE mg/dL
Nitrite: NEGATIVE
Protein, ur: 100 mg/dL — AB
RBC / HPF: >50 RBC/hpf — ABNORMAL HIGH (ref 0–5)
Specific Gravity, Urine: 1.024 (ref 1.005–1.030)
WBC, UA: >50 WBC/hpf — ABNORMAL HIGH (ref 0–5)
pH: 7 (ref 5.0–8.0)

## 2019-02-04 MED ORDER — FOSFOMYCIN TROMETHAMINE 3 G PO PACK
3.0000 g | PACK | Freq: Once | ORAL | Status: AC
  Administered 2019-02-04: 3 g via ORAL
  Filled 2019-02-04: qty 3

## 2019-02-04 NOTE — ED Provider Notes (Signed)
[facility]   CSN: [id number] Arrival date & time: 02/04/19  1714    History   Chief Complaint Chief Complaint  Patient presents with  . Abdominal Pain  . Dysuria    HPI Andrea Whitehead is a 23 y.o. female.  Presents emergency department with chief complaint of suprapubic pain and burning with urination.  Patient symptoms began today.  She denies fever, chills, flank pain, nausea, vomiting.  She denies vaginal symptoms such as foul odor, discharge, pain with intercourse.  She is never had a urinary tract infection in the past.  She denies constipation or diarrhea.     HPI  Past Medical History:  Diagnosis Date  . ADHD (attention deficit hyperactivity disorder)   . Asthma   . Bipolar disorder (HCC)   . Oppositional defiant disorder   . Vision abnormalities     Patient Active Problem List   Diagnosis Date Noted  . 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 Episodic mood disorder (HCC) 12/07/2012  . ODD (oppositional defiant disorder) 12/07/2012  . ADHD (attention deficit hyperactivity disorder) 05/27/2012    Past Surgical History:  Procedure Laterality Date  . NO PAST SURGERIES    . WRIST SURGERY Right      OB History    Gravida  1   Para  1   Term  1   Preterm      AB      Living  1     SAB      TAB      Ectopic      Multiple  0   Live Births  1            Home Medications    Prior to Admission medications   Medication Sig Start Date End Date Taking? Authorizing Provider  amoxicillin (AMOXIL) 500 MG capsule Take 2 capsules (1,000 mg total) by mouth 2 (two) times daily. Patient not taking: Reported on 02/04/2019 09/01/18   Elpidio AnisUpstill, Shari, PA-C  fluticasone [facility]FLONASE) 50 MCG/ACT nasal spray Place 1 spray into both nostrils daily. Patient not taking: Reported on 02/04/2019 09/01/18   Elpidio AnisUpstill, Shari, PA-C  ibuprofen (ADVIL,MOTRIN) 600 MG tablet Take 1 tablet  (600 mg total) by mouth every 6 (six) hours as needed. Patient not taking: Reported on 02/04/2019 09/01/18   Elpidio AnisUpstill, Shari, PA-C  oseltamivir (TAMIFLU) 75 MG capsule Take 1 capsule (75 mg total) by mouth every 12 (twelve) hours. Patient not taking: Reported on 02/04/2019 11/23/18   Elpidio AnisUpstill, Shari, PA-C    Family History Family History  Problem Relation Age of Onset  . Hypertension Father   . ADD / ADHD Mother   . Bipolar disorder Mother   . ADD / ADHD Brother   . Bipolar disorder Maternal Grandmother   . Diabetes Maternal Aunt     Social History Social History   Tobacco Use  . Smoking status: Current Every Day Smoker    Packs/day: 0.25    Types: Cigarettes  . Smokeless tobacco: Never Used  Substance Use Topics  . Alcohol use: No  . Drug use: No     Allergies   Patient has no known allergies.   Review of Systems Review of Systems Ten systems reviewed and are negative for acute change, except as noted in the HPI.    Physical Exam Updated Vital Signs Ht 5\' 5"  (1.651 m)   Wt 95.3 kg   BMI 34.95 kg/m   Physical Exam  Vitals signs and nursing note reviewed.  Constitutional:      General: She is not in acute distress.    Appearance: She is well-developed. She is not diaphoretic.  HENT:     Head: Normocephalic and atraumatic.  Eyes:     General: No scleral icterus.    Conjunctiva/sclera: Conjunctivae normal.  Neck:     Musculoskeletal: Normal range of motion.  Cardiovascular:     Rate and Rhythm: Normal rate and regular rhythm.     Heart sounds: Normal heart sounds. No murmur. No friction rub. No gallop.   Pulmonary:     Effort: Pulmonary effort is normal. No respiratory distress.     Breath sounds: Normal breath sounds.  Abdominal:     General: Bowel sounds are normal. There is no distension.     Palpations: Abdomen is soft. There is no mass.     Tenderness: There is abdominal tenderness in the suprapubic area. There is no right CVA tenderness, left CVA  tenderness or guarding.  Skin:    General: Skin is warm and dry.  Neurological:     Mental Status: She is alert and oriented to person, place, and time.  Psychiatric:        Behavior: Behavior normal.      ED Treatments / Results  Labs (all labs ordered are listed, but only abnormal results are displayed) Labs Reviewed  URINALYSIS, ROUTINE W REFLEX MICROSCOPIC  POC URINE PREG, ED    EKG None  Radiology No results found.  Procedures Procedures (including critical care time)  Medications Ordered in ED Medications - No data to display   Initial Impression / Assessment and Plan / ED Course  I have reviewed the triage vital signs and the nursing notes.  Pertinent labs & imaging results that were available during my care of the patient were reviewed by me and considered in my medical decision making (see chart for details).        Patient Urine shows infection. Uncomplicated UTI in young female, No evidence of pyelonephritis.  Treated here with single dose of fosfomycin. Discussed return precautions.   Final Clinical Impressions(s) / ED Diagnoses   Final diagnoses:  Acute cystitis without hematuria    ED Discharge Orders    None       Arthor Captain, PA-C 02/04/19 2324    Mancel Bale, MD 02/06/19 [phone]

## 2019-02-04 NOTE — ED Triage Notes (Signed)
Urine sent

## 2019-02-04 NOTE — Discharge Instructions (Signed)
You have a urinary tract infection. You are being treated here with a single dose of antibiotics that should treat the infection.  Return to the ER if you develop any of the following symptoms.

## 2019-02-04 NOTE — ED Triage Notes (Signed)
PT unable to obtain adequate volume of urine for  Lab test.Pt aware she needs to void.

## 2019-02-04 NOTE — ED Triage Notes (Signed)
Pt from home w/ c/o lower abd pain and dysuria that began today. The abd pain is constant and is a "weird sensation." She has a burning sensation when she urinates and feels like she has to urinate frequently, but has had little output. Urine has been cloudy. No hematuria noted. No N/V/D. Denies flank pain. No fever.

## 2019-02-13 ENCOUNTER — Ambulatory Visit (HOSPITAL_COMMUNITY)
Admission: EM | Admit: 2019-02-13 | Discharge: 2019-02-13 | Disposition: A | Discharge status: Home / Self Care | Payer: Medicaid Other | Attending: Family Medicine | Admitting: Family Medicine

## 2019-02-13 ENCOUNTER — Encounter (HOSPITAL_COMMUNITY): Payer: Self-pay

## 2019-02-13 ENCOUNTER — Other Ambulatory Visit: Payer: Self-pay

## 2019-02-13 DIAGNOSIS — B370 Candidal stomatitis: Secondary | ICD-10-CM | POA: Diagnosis not present

## 2019-02-13 MED ORDER — LIDOCAINE VISCOUS HCL 2 % MT SOLN: 15.0000 mL | OROMUCOSAL | 0 refills | Status: DC | PRN

## 2019-02-13 MED ORDER — FLUCONAZOLE 200 MG PO TABS: 200.0000 mg | ORAL_TABLET | Freq: Every day | ORAL | 0 refills | Status: AC

## 2019-02-13 NOTE — ED Notes (Signed)
Patient verbalizes understanding of discharge instructions. Opportunity for questioning and answers were provided. Patient discharged from [facility] by provider.  

## 2019-02-13 NOTE — ED Triage Notes (Signed)
Patient presents to Urgent Care with complaints of mouth pain, including her lips since 5 days ago. Patient reports this has not happened before. Pt has difficulty communicating r/t pain when opening mouth. [place] mucosal film noted on tongue when pt opens mouth.

## 2019-02-13 NOTE — Discharge Instructions (Addendum)
We are treating you for oral  thrush.  Take the fluconazole daily for 7 days. If symptoms continue after treatment or worsen before please come back for recheck.

## 2019-02-14 NOTE — ED Provider Notes (Signed)
[facility]    CSN: [id number] Arrival date & time: 02/13/19  1337     History   Chief Complaint Chief Complaint  Patient presents with  . Dental Pain    HPI Andrea Whitehead is a 23 y.o. female.   Pt is a 23 year old female that presents with mouth discomfort.  This has been present and worsening over last 5 days.  She is having problems opening her mouth due to pain.  She has had sores on the lips and thick coating in mouth.  Denies any history of the same.  Denies any mouth injuries, fevers, chills, body aches.  Denies any concern for STDs or any immunocompromise.  Denies being currently sexually active.  She was recently treated for a bladder infection about a week ago.  She was treated in the ER.  This was a one-time dose medication.  She has mild sore throat.  No cough, congestion or ear pain.  No recent sick contacts.   ROS per HPI      Past Medical History:  Diagnosis Date  . ADHD (attention deficit hyperactivity disorder)   . Asthma   . Bipolar disorder (HCC)   . Oppositional defiant disorder   . Vision abnormalities     Patient Active Problem List   Diagnosis Date Noted  . 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 Episodic mood disorder (HCC) 12/07/2012  . ODD (oppositional defiant disorder) 12/07/2012  . ADHD (attention deficit hyperactivity disorder) 05/27/2012    Past Surgical History:  Procedure Laterality Date  . NO PAST SURGERIES    . WRIST SURGERY Right     OB History    Gravida  1   Para  1   Term  1   Preterm      AB      Living  1     SAB      TAB      Ectopic      Multiple  0   Live Births  1            Home Medications    Prior to Admission medications   Medication Sig Start Date End Date Taking? Authorizing Provider  fluconazole (DIFLUCAN) 200 MG tablet Take 1 tablet (200 mg total) by mouth daily for 7 days. 02/13/19 02/20/19  Dahlia Byes A, NP  lidocaine (XYLOCAINE) 2 %  solution Use as directed 15 mLs in the mouth or throat as needed for mouth pain. 02/13/19   Janace Aris, NP    Family History Family History  Problem Relation Age of Onset  . Hypertension Father   . ADD / ADHD Mother   . Bipolar disorder Mother   . ADD / ADHD Brother   . Bipolar disorder Maternal Grandmother   . Diabetes Maternal Aunt     Social History Social History   Tobacco Use  . Smoking status: Current Every Day Smoker    Packs/day: 0.25    Types: Cigarettes  . Smokeless tobacco: Never Used  Substance Use Topics  . Alcohol use: No  . Drug use: No     Allergies   Patient has no known allergies.   Review of Systems Review of Systems   Physical Exam Triage Vital Signs ED Triage Vitals  Enc Vitals Group     BP 02/13/19 1357 102/67     Pulse --      Resp 02/13/19 1357 18     Temp 02/13/19 1357 98.7 F (  37.1 C)     Temp Source 02/13/19 1357 Oral     SpO2 02/13/19 1357 99 %     Weight --      Height --      Head Circumference --      Peak Flow --      Pain Score 02/13/19 1355 10     Pain Loc --      Pain Edu? --      Excl. in GC? --    No data found.  Updated Vital Signs BP 102/67 (BP Location: Left Arm)   Temp 98.7 F (37.1 C) (Oral)   Resp 18   SpO2 99%   Visual Acuity Right Eye Distance:   Left Eye Distance:   Bilateral Distance:    Right Eye Near:   Left Eye Near:    Bilateral Near:     Physical Exam Constitutional:      General: She is not in acute distress.    Appearance: Normal appearance. She is ill-appearing. She is not toxic-appearing or diaphoretic.  HENT:     Head: Normocephalic and atraumatic.     Nose: Nose normal.     Mouth/Throat:     Mouth: Oral lesions present.     Pharynx: Oropharyngeal exudate present.     Comments: See picture Pt with thick coating in the mouth and ulcers to the upper and lower lips. Mild trismus. Thick saliva  Eyes:     Conjunctiva/sclera: Conjunctivae normal.  Neck:     Musculoskeletal:  Normal range of motion.  Pulmonary:     Effort: Pulmonary effort is normal.  Musculoskeletal: Normal range of motion.  Skin:    General: Skin is warm and dry.  Neurological:     Mental Status: She is alert.  Psychiatric:        Mood and Affect: Mood normal.        UC Treatments / Results  Labs (all labs ordered are listed, but only abnormal results are displayed) Labs Reviewed - No data to display  EKG None  Radiology No results found.  Procedures Procedures (including critical care time)  Medications Ordered in UC Medications - No data to display  Initial Impression / Assessment and Plan / UC Course  I have reviewed the triage vital signs and the nursing notes.  Pertinent labs & imaging results that were available during my care of the patient were reviewed by me and considered in my medical decision making (see chart for details).    Thrush   Symptoms consistent with oral thrush.  Treating with fluconazole and lidocaine.  There is some concern for STD or immunocompromise with an otherwise healthy 23 year old female.  She is denying being sexually active or any concern for STDs.  Instructed that if her symptoms continue, re occur or worsen she will need to follow up for further testing and evaluation.  Pt understanding and agreed.  Final Clinical Impressions(s) / UC Diagnoses   Final diagnoses:  Oral thrush     Discharge Instructions     We are treating you for oral  thrush.  Take the fluconazole daily for 7 days. If symptoms continue after treatment or worsen before please come back for recheck.      ED Prescriptions    Medication Sig Dispense Auth. Provider   fluconazole (DIFLUCAN) 200 MG tablet Take 1 tablet (200 mg total) by mouth daily for 7 days. 7 tablet Brentt Fread A, NP   lidocaine (XYLOCAINE) 2 %  solution Use as directed 15 mLs in the mouth or throat as needed for mouth pain. 100 mL Dahlia Byes A, NP     Controlled Substance Prescriptions  [place] Controlled Substance Registry consulted? Not Applicable   Janace Aris, NP 02/14/19 1134

## 2019-03-12 ENCOUNTER — Other Ambulatory Visit: Payer: Self-pay

## 2019-03-12 ENCOUNTER — Encounter (HOSPITAL_BASED_OUTPATIENT_CLINIC_OR_DEPARTMENT_OTHER): Payer: Self-pay | Admitting: *Deleted

## 2019-03-12 ENCOUNTER — Emergency Department (HOSPITAL_BASED_OUTPATIENT_CLINIC_OR_DEPARTMENT_OTHER): Payer: Medicaid Other

## 2019-03-12 ENCOUNTER — Emergency Department (HOSPITAL_BASED_OUTPATIENT_CLINIC_OR_DEPARTMENT_OTHER)
Admission: EM | Admit: 2019-03-12 | Discharge: 2019-03-12 | Disposition: A | Discharge status: Home / Self Care | Payer: Medicaid Other | Attending: Emergency Medicine | Admitting: Emergency Medicine

## 2019-03-12 DIAGNOSIS — F17210 Nicotine dependence, cigarettes, uncomplicated: Secondary | ICD-10-CM | POA: Diagnosis not present

## 2019-03-12 DIAGNOSIS — J36 Peritonsillar abscess: Secondary | ICD-10-CM

## 2019-03-12 DIAGNOSIS — F909 Attention-deficit hyperactivity disorder, unspecified type: Secondary | ICD-10-CM | POA: Diagnosis not present

## 2019-03-12 DIAGNOSIS — Z79899 Other long term (current) drug therapy: Secondary | ICD-10-CM | POA: Insufficient documentation

## 2019-03-12 DIAGNOSIS — J029 Acute pharyngitis, unspecified: Secondary | ICD-10-CM | POA: Diagnosis present

## 2019-03-12 DIAGNOSIS — J45909 Unspecified asthma, uncomplicated: Secondary | ICD-10-CM | POA: Insufficient documentation

## 2019-03-12 LAB — CBC WITH DIFFERENTIAL/PLATELET
Abs Immature Granulocytes: 0.07 10*3/uL (ref 0.00–0.07)
Basophils Absolute: 0 10*3/uL (ref 0.0–0.1)
Basophils Relative: 0 %
Eosinophils Absolute: 0 10*3/uL (ref 0.0–0.5)
Eosinophils Relative: 0 %
HCT: 39.7 % (ref 36.0–46.0)
Hemoglobin: 12.9 g/dL (ref 12.0–15.0)
Immature Granulocytes: 1 %
Lymphocytes Relative: 15 %
Lymphs Abs: 1.9 10*3/uL (ref 0.7–4.0)
MCH: 30.4 pg (ref 26.0–34.0)
MCHC: 32.5 g/dL (ref 30.0–36.0)
MCV: 93.4 fL (ref 80.0–100.0)
Monocytes Absolute: 1.1 10*3/uL — ABNORMAL HIGH (ref 0.1–1.0)
Monocytes Relative: 9 %
Neutro Abs: 9.7 10*3/uL — ABNORMAL HIGH (ref 1.7–7.7)
Neutrophils Relative %: 75 %
Platelets: 148 10*3/uL — ABNORMAL LOW (ref 150–400)
RBC: 4.25 MIL/uL (ref 3.87–5.11)
RDW: 13.4 % (ref 11.5–15.5)
WBC: 12.8 10*3/uL — ABNORMAL HIGH (ref 4.0–10.5)
nRBC: 0 % (ref 0.0–0.2)

## 2019-03-12 LAB — GROUP A STREP BY PCR: Group A Strep by PCR: NOT DETECTED

## 2019-03-12 LAB — BASIC METABOLIC PANEL
Anion gap: 11 (ref 5–15)
BUN: 7 mg/dL (ref 6–20)
CO2: 22 mmol/L (ref 22–32)
Calcium: 9 mg/dL (ref 8.9–10.3)
Chloride: 106 mmol/L (ref 98–111)
Creatinine, Ser: 0.81 mg/dL (ref 0.44–1.00)
GFR calc Af Amer: >60 mL/min (ref >60)
GFR calc non Af Amer: >60 mL/min (ref >60)
Glucose, Bld: 87 mg/dL (ref 70–99)
Potassium: 3.3 mmol/L — ABNORMAL LOW (ref 3.5–5.1)
Sodium: 139 mmol/L (ref 135–145)

## 2019-03-12 LAB — PREGNANCY, URINE: Preg Test, Ur: NEGATIVE

## 2019-03-12 MED ORDER — POTASSIUM CHLORIDE 10 MEQ/100ML IV SOLN
10.0000 meq | Freq: Once | INTRAVENOUS | Status: AC
  Administered 2019-03-12: 17:00:00 10 meq via INTRAVENOUS
  Filled 2019-03-12: qty 100

## 2019-03-12 MED ORDER — DEXAMETHASONE SODIUM PHOSPHATE 10 MG/ML IJ SOLN
10.0000 mg | Freq: Once | INTRAMUSCULAR | Status: AC
  Administered 2019-03-12: 17:00:00 10 mg via INTRAVENOUS
  Filled 2019-03-12: qty 1

## 2019-03-12 MED ORDER — CLINDAMYCIN PHOSPHATE 600 MG/50ML IV SOLN
600.0000 mg | Freq: Once | INTRAVENOUS | Status: AC
  Administered 2019-03-12: 19:00:00 600 mg via INTRAVENOUS
  Filled 2019-03-12: qty 50

## 2019-03-12 MED ORDER — ACETAMINOPHEN 650 MG RE SUPP
650.0000 mg | Freq: Once | RECTAL | Status: AC
  Administered 2019-03-12: 17:00:00 650 mg via RECTAL
  Filled 2019-03-12: qty 1

## 2019-03-12 MED ORDER — IOHEXOL 300 MG/ML  SOLN
100.0000 mL | Freq: Once | INTRAMUSCULAR | Status: AC | PRN
  Administered 2019-03-12: 18:00:00 75 mL via INTRAVENOUS

## 2019-03-12 MED ORDER — FENTANYL CITRATE (PF) 100 MCG/2ML IJ SOLN
50.0000 ug | Freq: Once | INTRAMUSCULAR | Status: AC
  Administered 2019-03-12: 17:00:00 50 ug via INTRAVENOUS
  Filled 2019-03-12: qty 2

## 2019-03-12 MED ORDER — SODIUM CHLORIDE 0.9 % IV BOLUS
1000.0000 mL | Freq: Once | INTRAVENOUS | Status: AC
  Administered 2019-03-12: 17:00:00 1000 mL via INTRAVENOUS

## 2019-03-12 MED ORDER — CLINDAMYCIN HCL 150 MG PO CAPS: 450.0000 mg | ORAL_CAPSULE | Freq: Three times a day (TID) | ORAL | 0 refills | Status: AC

## 2019-03-12 NOTE — Discharge Instructions (Signed)
You are seen in the emergency department today and found to have a peritonsillar abscess.  We are treating this with antibiotics, clindamycin, take this as prescribed.  Please take Tylenol and/or Motrin per over-the-counter dosing to help with discomfort.  Your potassium was mildly low, please see the attached food diet guidelines.  We have prescribed you new medication(s) today. Discuss the medications prescribed today with your pharmacist as they can have adverse effects and interactions with your other medicines including over the counter and prescribed medications. Seek medical evaluation if you start to experience new or abnormal symptoms after taking one of these medicines, seek care immediately if you start to experience difficulty breathing, feeling of your throat closing, facial swelling, or rash as these could be indications of a more serious allergic reaction  Follow-up with Dr. Pollyann Kennedy, an ear nose and throat doctor provided in your discharge instructions.  Call the office tomorrow morning to make an appointment for the next 24 to 48 hours.  Return to the ER for new or worsening symptoms including but not limited to worsening pain, trouble breathing, inability to swallow, need to open your mouth, trouble moving your neck, or any other concerns.

## 2019-03-12 NOTE — ED Triage Notes (Signed)
Pt reports having oral thrush x 2 weeks. Now having pain and swelling with tonsils. Pt having difficulty opening her mouth due to pain. Riedlinger lesions noted on tongue. Voice muffled

## 2019-03-12 NOTE — ED Provider Notes (Signed)
[facility] EMERGENCY DEPARTMENT Provider Note   CSN: [id number] Arrival date & time: 03/12/19  1558  History   Chief Complaint Chief Complaint  Patient presents with  . Sore Throat    HPI Audery Amellauna Sigal is a 23 y.o. female with tobacco abuse, asthma, bipolar disorder, & ODD who presents to the ED with complaints of sore throat that began 2 days prior and is progressively worsening. Patient states she is having pain primarily to the L side of her throat into the neck. She states the pain is constant, worse w/ attempts to swallow, open her mouth, & speak, no alleviating factors. No intervention PTA. She feels she can barely tolerate swallowing her saliva because it is so painful & her throat feels swollen, but is able to do so. Does not feel she can take pills or eat. No hx of similar. Notes that she was an at urgent care for intra oral pain, ulcers, and thrush at the beginning of the month, was treated w/ 200 mg daily diflucan x 7 days & viscous lidocaine w/ complete resolution of her symptoms. She has noted some mild coating to her tongue again, but otherwise her presentation today she states is very different. Denies fever, chills, congestion, ear pain, nausea, vomiting, dyspnea, chest pain, or cough. Denies concern for STDs. No one sick w/ similar sxs.     HPI  Past Medical History:  Diagnosis Date  . ADHD (attention deficit hyperactivity disorder)   . Asthma   . Bipolar disorder (HCC)   . Oppositional defiant disorder   . Vision abnormalities     Patient Active Problem List   Diagnosis Date Noted  . 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 Episodic mood disorder (HCC) 12/07/2012  . ODD (oppositional defiant disorder) 12/07/2012  . ADHD (attention deficit hyperactivity disorder) 05/27/2012    Past Surgical History:  Procedure Laterality Date  . NO PAST SURGERIES    . WRIST SURGERY Right      OB History    Gravida  1   Para   1   Term  1   Preterm      AB      Living  1     SAB      TAB      Ectopic      Multiple  0   Live Births  1            Home Medications    Prior to Admission medications   Medication Sig Start Date End Date Taking? Authorizing Provider  lidocaine (XYLOCAINE) 2 % solution Use as directed 15 mLs in the mouth or throat as needed for mouth pain. 02/13/19   Janace ArisBast, Traci A, NP    Family History Family History  Problem Relation Age of Onset  . Hypertension Father   . ADD / ADHD Mother   . Bipolar disorder Mother   . ADD / ADHD Brother   . Bipolar disorder Maternal Grandmother   . Diabetes Maternal Aunt     Social History Social History   Tobacco Use  . Smoking status: Current Every Day Smoker    Packs/day: 0.25    Types: Cigarettes  . Smokeless tobacco: Never Used  Substance Use Topics  . Alcohol use: No  . Drug use: No     Allergies   Patient has no known allergies.   Review of Systems Review of Systems  Constitutional: Positive for appetite change. Negative for chills and  fever.  HENT: Positive for sore throat, trouble swallowing and voice change. Negative for congestion, ear pain and rhinorrhea.   Respiratory: Negative for cough and shortness of breath.   Cardiovascular: Negative for chest pain.  Gastrointestinal: Negative for abdominal pain and vomiting.  Musculoskeletal: Positive for neck pain.  All other systems reviewed and are negative.    Physical Exam Updated Vital Signs BP (!) 126/100 (BP Location: Right Arm)   Pulse (!) 111   Temp 98.9 F (37.2 C) (Oral)   Resp 20   SpO2 100%   Physical Exam Vitals signs and nursing note reviewed.  Constitutional:      General: She is not in acute distress.    Appearance: She is well-developed.  HENT:     Head: Normocephalic and atraumatic.     Right Ear: Ear canal normal. Tympanic membrane is not perforated, erythematous, retracted or bulging.     Left Ear: Ear canal normal. Tympanic  membrane is not perforated, erythematous, retracted or bulging.     Ears:     Comments: No mastoid erythema/swelling/tenderness.     Nose:     Right Sinus: No maxillary sinus tenderness or frontal sinus tenderness.     Left Sinus: No maxillary sinus tenderness or frontal sinus tenderness.     Mouth/Throat:     Mouth: Mucous membranes are dry.     Pharynx: Uvula midline. Posterior oropharyngeal erythema present. No oropharyngeal exudate.     Comments: Patient has very minimal trismus able to open her mouth 2 finger widths, able to visualize posterior orophaynx somewhat there is erythema w/ tonsillar edema L >R. Uvula appears fairly midline. Patient tolerating own secretions without difficulty. No drooling. Voice is very mildly muffled. No significant swelling beneath the tongue. There is mild Kettles coating to the tongue. No intra-oral ulcers.  Eyes:     General:        Right eye: No discharge.        Left eye: No discharge.     Conjunctiva/sclera: Conjunctivae normal.     Pupils: Pupils are equal, round, and reactive to light.  Neck:     Musculoskeletal: Neck supple. No neck rigidity.      Comments: ROM intact.  Cardiovascular:     Rate and Rhythm: Regular rhythm. Tachycardia present.     Heart sounds: No murmur.  Pulmonary:     Effort: Pulmonary effort is normal. No respiratory distress.     Breath sounds: Normal breath sounds. No stridor. No wheezing, rhonchi or rales.  Abdominal:     General: There is no distension.     Palpations: Abdomen is soft.     Tenderness: There is no abdominal tenderness.  Lymphadenopathy:     Cervical: Cervical adenopathy present.  Skin:    General: Skin is warm and dry.     Findings: No rash.  Neurological:     Mental Status: She is alert.  Psychiatric:        Behavior: Behavior normal.    ED Treatments / Results  Labs (all labs ordered are listed, but only abnormal results are displayed) Labs Reviewed  CBC WITH DIFFERENTIAL/PLATELET -  Abnormal; Notable for the following components:      Result Value   WBC 12.8 (*)    Platelets 148 (*)    Neutro Abs 9.7 (*)    Monocytes Absolute 1.1 (*)    All other components within normal limits  BASIC METABOLIC PANEL - Abnormal; Notable for the following components:  Potassium 3.3 (*)    All other components within normal limits  GROUP A STREP BY PCR  PREGNANCY, URINE    EKG None  Radiology Ct Soft Tissue Neck W Contrast  Result Date: 03/12/2019 CLINICAL DATA:  Sore throat/stridor with epiglottitis or tonsillitis suspected EXAM: CT NECK WITH CONTRAST TECHNIQUE: Multidetector CT imaging of the neck was performed using the standard protocol following the bolus administration of intravenous contrast. CONTRAST:  1mL OMNIPAQUE IOHEXOL 300 MG/ML  SOLN COMPARISON:  None. FINDINGS: Pharynx and larynx: Thickened palatine tonsils with 8 mm abscess in the left peritonsillar region where there is greater submucosal edema that tracks into the supraglottic larynx, thickening the aryepiglottic fold. Retropharyngeal edema without retropharyngeal abscess. The airway remains patent. Salivary glands: No primary inflammation. Thyroid: Normal Lymph nodes: Reactive enlargement of left more than right jugular chain lymph nodes without drainable collection. Vascular: Negative.  No venous thrombosis. Limited intracranial: Negative Visualized orbits: Negative Mastoids and visualized paranasal sinuses: Clear Skeleton: Negative Upper chest: Clear apical lungs IMPRESSION: 1. Tonsillitis with 8 mm left peritonsillar abscess. Pharyngitis with asymmetric submucosal edema on the left which tracks into the supraglottic larynx. 2. Left more than right cervical adenitis. Electronically Signed   By: Marnee Spring M.D.   On: 03/12/2019 18:12    Procedures Procedures (including critical care time)  Medications Ordered in ED Medications - No data to display   Initial Impression / Assessment and Plan / ED Course  I  have reviewed the triage vital signs and the nursing notes.  Pertinent labs & imaging results that were available during my care of the patient were reviewed by me and considered in my medical decision making (see chart for details).   Patient presents to the ED w/ complaints of L sided sore throat which radiates into the neck. Nontoxic appearing, mildly tachycardic w/ low grade temp @ 100.1 orally. On exam patient's voice is somewhat muffled and she has very minimal trismus. Tolerating own secretions without difficulty, no drooling. Mild Skiver coating to the tongue, mucous membranes are dry. Posterior oropharynx is erythematous w/ 2+ tonsils, symmetric appearing. L anterior cervical lymphadenopathy, also w/ exquisite L anterior neck tenderness to palpation- no overlying erythema/warmth or palpable fluctuance. W/ her presentation will plan for labs, strep swab, and CT soft tissue neck for further assessment. Decadron ordered given patient feels she cannot swallow secondary to pain/swelling, fentanyl for additional pain control, and tylenol for low grade temp- patient elected for this to be suppository as she feels she cannot swallow tablets or liquid medicine right now. No respiratory distress. DDX: strep, viral pharyngitis, PTA, RPA  Work-up reviewed:  CBC: Mild leukocytosis @ 12,800 w/ L shift. Mild thrombocytopenia consistent w/ prior.  BMP: Mild hypokalemia @ 3.3 will give 1 run of K as patient is not tolerating PO well due to pain/swelling sxs and potassium tablets are quite large. No renal dysfunction.  Strep: Negative Preg test: Negative  CT:  1. Tonsillitis with 8 mm left peritonsillar abscess. Pharyngitis with asymmetric submucosal edema on the left which tracks into the supraglottic larynx. 2. Left more than right cervical adenitis.   18:43: CONSULT: Discussed case w/ ENT Dr. Pollyann Kennedy- recommends clindamycin & follow up in clinic, call tomorrow AM for appointment.   18:50: RE-EVAL: Patient  feeling much better following ER interventions. Plan for fluids challenge & 1 run of IV clindamycin.   Patient tolerating PO w/o difficulty. Vitals improved.  Discharge home w/ clindamycin, motrin/tylenol for any continued discomfort. I discussed results,  treatment plan, need for follow-up, and return precautions with the patient. Provided opportunity for questions, patient confirmed understanding and is in agreement with plan.   Findings and plan of care discussed with supervising physician Dr. Lockie Mola who is in agreement.   Vitals:   03/12/19 1740 03/12/19 1741  BP: 106/69   Pulse: 94   Resp:    Temp:  98.3 F (36.8 C)  SpO2: 98%     Final Clinical Impressions(s) / ED Diagnoses   Final diagnoses:  Peritonsillar abscess    ED Discharge Orders         Ordered    clindamycin (CLEOCIN) 150 MG capsule  3 times daily     03/12/19 1913           Cherly Anderson, PA-C 03/12/19 1958    Virgina Norfolk, DO 03/12/19 2136

## 2019-03-12 NOTE — ED Notes (Addendum)
ED Provider at bedside and made aware of pt's temperature. Pt states she is unable to swallow anything

## 2019-04-28 ENCOUNTER — Ambulatory Visit (INDEPENDENT_AMBULATORY_CARE_PROVIDER_SITE_OTHER): Payer: Medicaid Other

## 2019-04-28 ENCOUNTER — Other Ambulatory Visit: Payer: Self-pay

## 2019-04-28 VITALS — BP 99/63 | HR 72 | Ht 65.0 in | Wt 202.0 lb

## 2019-04-28 DIAGNOSIS — Z3202 Encounter for pregnancy test, result negative: Secondary | ICD-10-CM | POA: Diagnosis not present

## 2019-04-28 LAB — POCT URINE PREGNANCY: Preg Test, Ur: NEGATIVE

## 2019-04-28 NOTE — Progress Notes (Signed)
Ms. Benge presents today for UPT. She has no unusual complaints.  LMP: 03/17/2019 approximately    OBJECTIVE: Appears well, in no apparent distress.  OB History    Gravida  1   Para  1   Term  1   Preterm      AB      Living  1     SAB      TAB      Ectopic      Multiple  0   Live Births  1          Home UPT Result: Not done In-Office UPT result: NEGATIVE  I have reviewed the patient's medical, obstetrical, social, and family histories, and medications.   ASSESSMENT: Positive pregnancy test  PLAN Patient is to do Home Pregnancy Test in 2 weeks and PRN.  Prenatal care to be completed at: NA

## 2019-08-03 ENCOUNTER — Telehealth: Payer: Self-pay

## 2019-08-03 NOTE — Telephone Encounter (Signed)
Pt called and reports that she is about 4 months pregnant she thinks and has been bleeding "like a period" since yesterday. Pt reports she has not received any prenatal care anywhere thusfar in the pregnancy. Pt reports good fetal movement, denies contractions. Advised pt to go to [facility] ASAP for evaluation of the bleeding. Pt verbalizes understanding and says she will go.

## 2019-10-09 ENCOUNTER — Emergency Department (HOSPITAL_BASED_OUTPATIENT_CLINIC_OR_DEPARTMENT_OTHER)
Admission: EM | Admit: 2019-10-09 | Discharge: 2019-10-09 | Discharge status: Against Medical Advice | Payer: Medicaid Other

## 2019-10-09 ENCOUNTER — Other Ambulatory Visit: Payer: Self-pay

## 2019-10-09 NOTE — ED Notes (Signed)
Pt asked registration how long the wait was then decided not to stay

## 2020-02-17 ENCOUNTER — Ambulatory Visit: Payer: Medicaid Other

## 2020-02-23 ENCOUNTER — Telehealth: Payer: Self-pay | Admitting: *Deleted

## 2020-02-27 ENCOUNTER — Other Ambulatory Visit: Payer: Self-pay

## 2020-02-27 ENCOUNTER — Encounter (HOSPITAL_BASED_OUTPATIENT_CLINIC_OR_DEPARTMENT_OTHER): Payer: Self-pay | Admitting: Emergency Medicine

## 2020-02-27 ENCOUNTER — Emergency Department (HOSPITAL_BASED_OUTPATIENT_CLINIC_OR_DEPARTMENT_OTHER)
Admission: EM | Admit: 2020-02-27 | Discharge: 2020-02-27 | Disposition: A | Discharge status: Home / Self Care | Payer: Medicaid Other | Attending: Emergency Medicine | Admitting: Emergency Medicine

## 2020-02-27 DIAGNOSIS — M5459 Other low back pain: Secondary | ICD-10-CM

## 2020-02-27 DIAGNOSIS — F17210 Nicotine dependence, cigarettes, uncomplicated: Secondary | ICD-10-CM | POA: Diagnosis not present

## 2020-02-27 DIAGNOSIS — M545 Low back pain: Secondary | ICD-10-CM | POA: Diagnosis present

## 2020-02-27 DIAGNOSIS — J45909 Unspecified asthma, uncomplicated: Secondary | ICD-10-CM | POA: Diagnosis not present

## 2020-02-27 LAB — URINALYSIS, ROUTINE W REFLEX MICROSCOPIC
Bilirubin Urine: NEGATIVE
Glucose, UA: NEGATIVE mg/dL
Hgb urine dipstick: NEGATIVE
Ketones, ur: NEGATIVE mg/dL
Leukocytes,Ua: NEGATIVE
Nitrite: NEGATIVE
Protein, ur: NEGATIVE mg/dL
Specific Gravity, Urine: 1.015 (ref 1.005–1.030)
pH: 8 (ref 5.0–8.0)

## 2020-02-27 LAB — PREGNANCY, URINE: Preg Test, Ur: NEGATIVE

## 2020-02-27 MED ORDER — LIDOCAINE 5 % EX PTCH
2.0000 | MEDICATED_PATCH | CUTANEOUS | Status: DC
  Administered 2020-02-27: 2 via TRANSDERMAL
  Filled 2020-02-27: qty 2

## 2020-02-27 MED ORDER — IBUPROFEN 800 MG PO TABS
800.0000 mg | ORAL_TABLET | Freq: Once | ORAL | Status: AC
  Administered 2020-02-27: 800 mg via ORAL
  Filled 2020-02-27: qty 1

## 2020-02-27 MED ORDER — ACETAMINOPHEN 500 MG PO TABS
1000.0000 mg | ORAL_TABLET | Freq: Once | ORAL | Status: AC
  Administered 2020-02-27: 1000 mg via ORAL
  Filled 2020-02-27: qty 2

## 2020-02-27 MED ORDER — LIDOCAINE 5 % EX PTCH: 1.0000 | MEDICATED_PATCH | CUTANEOUS | 0 refills | Status: DC

## 2020-02-27 NOTE — ED Notes (Signed)
Pt. Reports Low back pain and an abscess under the L arm pit.  Pt. States the abscess has been draining.  Pt. Is reporting the low back pain started approx a week ago.

## 2020-02-27 NOTE — ED Provider Notes (Signed)
[facility] EMERGENCY DEPARTMENT Provider Note   CSN: [id number] Arrival date & time: 02/27/20  2211     History Chief Complaint  Patient presents with  . Back Pain    Andrea Whitehead is a 24 y.o. female.  The history is provided by the patient.  Back Pain Location:  Sacro-iliac joint Quality:  Aching Radiates to:  Does not radiate Pain severity:  Moderate Pain is:  Same all the time Onset quality:  Gradual Duration:  3 weeks Timing:  Constant Progression:  Unchanged Context: not emotional stress, not falling, not jumping from heights and not lifting heavy objects   Relieved by:  Nothing Worsened by:  Nothing Ineffective treatments:  None tried Associated symptoms: no abdominal pain, no abdominal swelling, no bladder incontinence, no bowel incontinence, no chest pain, no dysuria, no fever, no headaches, no leg pain, no numbness, no paresthesias, no pelvic pain, no perianal numbness, no tingling, no weakness and no weight loss   Risk factors: no hx of cancer   Pain over the posterior iliac area for 3 weeks.  No weakness no numbness.  No gait abnormalities.  Also has a chronically open area in the left axilla and reports she vomiting several weeks ago.  No f/c/r. No abdominal pain.  No neck pain.  No trauma.  No urinary nor vaginal complaints.  No changes in vision or speech or cognition.       Past Medical History:  Diagnosis Date  . ADHD (attention deficit hyperactivity disorder)   . Asthma   . Bipolar disorder (HCC)   . Oppositional defiant disorder   . Vision abnormalities     Patient Active Problem List   Diagnosis Date Noted  . 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 Episodic mood disorder (HCC) 12/07/2012  . ODD (oppositional defiant disorder) 12/07/2012  . ADHD (attention deficit hyperactivity disorder) 05/27/2012    Past Surgical History:  Procedure Laterality Date  . NO PAST SURGERIES    . WRIST SURGERY Right       OB History    Gravida  1   Para  1   Term  1   Preterm      AB      Living  1     SAB      TAB      Ectopic      Multiple  0   Live Births  1           Family History  Problem Relation Age of Onset  . Hypertension Father   . ADD / ADHD Mother   . Bipolar disorder Mother   . ADD / ADHD Brother   . Bipolar disorder Maternal Grandmother   . Diabetes Maternal Aunt     Social History   Tobacco Use  . Smoking status: Current Every Day Smoker    Packs/day: 0.25    Types: Cigarettes  . Smokeless tobacco: Never Used  Substance Use Topics  . Alcohol use: No  . Drug use: No    Home Medications Prior to Admission medications   Medication Sig Start Date End Date Taking? Authorizing Provider  lidocaine (XYLOCAINE) 2 % solution Use as directed 15 mLs in the mouth or throat as needed for mouth pain. 02/13/19   Janace Aris, NP    Allergies    Patient has no known allergies.  Review of Systems   Review of Systems  Constitutional: Negative for fever and weight loss.  HENT: Negative for congestion.   Eyes: Negative for photophobia and visual disturbance.  Respiratory: Negative for cough and shortness of breath.   Cardiovascular: Negative for chest pain.  Gastrointestinal: Negative for abdominal pain, bowel incontinence and diarrhea.  Genitourinary: Negative for bladder incontinence, dysuria, pelvic pain, vaginal bleeding and vaginal discharge.  Musculoskeletal: Positive for back pain. Negative for gait problem, neck pain and neck stiffness.  Skin: Negative for rash.  Neurological: Negative for tingling, speech difficulty, weakness, numbness, headaches and paresthesias.  Psychiatric/Behavioral: Negative for agitation, confusion, decreased concentration and hallucinations.  All other systems reviewed and are negative.   Physical Exam Updated Vital Signs BP 126/68 (BP Location: Right Arm)   Pulse 85   Temp 98.9 F (37.2 C) (Oral)   Resp 18   Ht 5'  5" (1.651 m)   Wt 105.1 kg   LMP 02/19/2020 (Exact Date)   SpO2 96%   BMI 38.56 kg/m   Physical Exam Vitals and nursing note reviewed.  Constitutional:      General: She is not in acute distress.    Appearance: Normal appearance.  HENT:     Head: Normocephalic and atraumatic.     Nose: Nose normal.  Eyes:     Extraocular Movements: Extraocular movements intact.     Conjunctiva/sclera: Conjunctivae normal.     Pupils: Pupils are equal, round, and reactive to light.     Comments: No proptosis disk margins sharp intact cognition.    Cardiovascular:     Rate and Rhythm: Normal rate and regular rhythm.     Pulses: Normal pulses.     Heart sounds: Normal heart sounds.  Pulmonary:     Effort: Pulmonary effort is normal.     Breath sounds: Normal breath sounds.  Abdominal:     General: Abdomen is flat. Bowel sounds are normal.     Tenderness: There is no abdominal tenderness. There is no guarding.  Musculoskeletal:        General: Normal range of motion.     Cervical back: Normal range of motion and neck supple.     Comments: No abscess nor drainage in the left axilla.  No erythema no warmth no lesions  Skin:    General: Skin is warm and dry.     Capillary Refill: Capillary refill takes less than 2 seconds.  Neurological:     General: No focal deficit present.     Mental Status: She is alert and oriented to person, place, and time.     Deep Tendon Reflexes: Reflexes normal.  Psychiatric:        Mood and Affect: Mood normal.        Behavior: Behavior normal.     ED Results / Procedures / Treatments   Labs (all labs ordered are listed, but only abnormal results are displayed) Results for orders placed or performed during the hospital encounter of 02/27/20  Urinalysis, Routine w reflex microscopic- may I&O cath if menses  Result Value Ref Range   Color, Urine YELLOW YELLOW   APPearance CLEAR CLEAR   Specific Gravity, Urine 1.015 1.005 - 1.030   pH 8.0 5.0 - 8.0    Glucose, UA NEGATIVE NEGATIVE mg/dL   Hgb urine dipstick NEGATIVE NEGATIVE   Bilirubin Urine NEGATIVE NEGATIVE   Ketones, ur NEGATIVE NEGATIVE mg/dL   Protein, ur NEGATIVE NEGATIVE mg/dL   Nitrite NEGATIVE NEGATIVE   Leukocytes,Ua NEGATIVE NEGATIVE  Pregnancy, urine  Result Value Ref Range   Preg Test, Ur NEGATIVE NEGATIVE  No results found.  Radiology No results found.  Procedures Procedures (including critical care time)  Medications Ordered in ED Medications  acetaminophen (TYLENOL) tablet 1,000 mg (has no administration in time range)  ibuprofen (ADVIL) tablet 800 mg (has no administration in time range)  lidocaine (LIDODERM) 5 % 2 patch (has no administration in time range)    ED Course  I have reviewed the triage vital signs and the nursing notes.  Pertinent labs & imaging results that were available during my care of the patient were reviewed by me and considered in my medical decision making (see chart for details).   Pain is mechanical in nature.  I have advised Nsaids, posture exercises.  There is no abscess in the axilla.  There are no acute findings.  No signs of cord compression.  No signs of meningitis, encephalitis, ICH, no signs of cavernous sinus thrombosis.  Patient is well appearing and resting comfortably in the room with all the lights on.  I do not believe this patient requires imaging or labs at this time. Follow up with your PMD for ongoing care.   Andrea Whitehead was evaluated in Emergency Department on 02/27/2020 for the symptoms described in the history of present illness. She was evaluated in the context of the global COVID-19 pandemic, which necessitated consideration that the patient might be at risk for infection with the SARS-CoV-2 virus that causes COVID-19. Institutional protocols and algorithms that pertain to the evaluation of patients at risk for COVID-19 are in a state of rapid change based on information released by regulatory bodies including the  CDC and federal and state organizations. These policies and algorithms were followed during the patient's care in the ED.   Final Clinical Impression(s) / ED Diagnoses Return for intractable cough, coughing up blood,fevers >100.4 unrelieved by medication, shortness of breath, intractable vomiting, chest pain, shortness of breath, weakness,numbness, changes in speech, facial asymmetry,abdominal pain, passing out,Inability to tolerate liquids or food, cough, altered mental status or any concerns. No signs of systemic illness or infection. The patient is nontoxic-appearing on exam and vital signs are within normal limits.   I have reviewed the triage vital signs and the nursing notes. Pertinent labs &imaging results that were available during my care of the patient were reviewed by me and considered in my medical decision making (see chart for details).After history, exam, and medical workup I feel the patient has beenappropriately medically screened and is safe for discharge home. Pertinent diagnoses were discussed with the patient. Patient was given return precautions.    Sally Reimers, MD 02/27/20 2329

## 2020-02-27 NOTE — ED Triage Notes (Signed)
Pt brought in by EMS with c/o back pain, headache, N/V  Pt states onset 3 weeks ago   Pt states the pain is in her lower back   Pt states it is so bad she can barely sit up in the morning  N/V is intermittent

## 2020-04-30 ENCOUNTER — Encounter (HOSPITAL_BASED_OUTPATIENT_CLINIC_OR_DEPARTMENT_OTHER): Payer: Self-pay

## 2020-04-30 ENCOUNTER — Other Ambulatory Visit: Payer: Self-pay

## 2020-04-30 ENCOUNTER — Emergency Department (HOSPITAL_BASED_OUTPATIENT_CLINIC_OR_DEPARTMENT_OTHER): Payer: Medicaid Other

## 2020-04-30 ENCOUNTER — Emergency Department (HOSPITAL_BASED_OUTPATIENT_CLINIC_OR_DEPARTMENT_OTHER)
Admission: EM | Admit: 2020-04-30 | Discharge: 2020-04-30 | Disposition: A | Discharge status: Home / Self Care | Payer: Medicaid Other | Attending: Emergency Medicine | Admitting: Emergency Medicine

## 2020-04-30 DIAGNOSIS — W2105XA Struck by basketball, initial encounter: Secondary | ICD-10-CM | POA: Diagnosis not present

## 2020-04-30 DIAGNOSIS — Y999 Unspecified external cause status: Secondary | ICD-10-CM | POA: Diagnosis not present

## 2020-04-30 DIAGNOSIS — Y92310 Basketball court as the place of occurrence of the external cause: Secondary | ICD-10-CM | POA: Diagnosis not present

## 2020-04-30 DIAGNOSIS — F17210 Nicotine dependence, cigarettes, uncomplicated: Secondary | ICD-10-CM | POA: Diagnosis not present

## 2020-04-30 DIAGNOSIS — S60922A Unspecified superficial injury of left hand, initial encounter: Secondary | ICD-10-CM | POA: Diagnosis present

## 2020-04-30 DIAGNOSIS — S62337A Displaced fracture of neck of fifth metacarpal bone, left hand, initial encounter for closed fracture: Secondary | ICD-10-CM

## 2020-04-30 DIAGNOSIS — Y9367 Activity, basketball: Secondary | ICD-10-CM | POA: Insufficient documentation

## 2020-04-30 DIAGNOSIS — J45909 Unspecified asthma, uncomplicated: Secondary | ICD-10-CM | POA: Diagnosis not present

## 2020-04-30 MED ORDER — HYDROCODONE-ACETAMINOPHEN 5-325 MG PO TABS: 1.0000 | ORAL_TABLET | Freq: Four times a day (QID) | ORAL | 0 refills | Status: AC | PRN

## 2020-04-30 NOTE — ED Triage Notes (Signed)
Pt reports injury to right hand yesterday while playing basketball-NAD-steady gait

## 2020-04-30 NOTE — Discharge Instructions (Signed)
You can take Tylenol or Ibuprofen as directed for pain. You can alternate Tylenol and Ibuprofen every 4 hours. If you take Tylenol at 1pm, then you can take Ibuprofen at 5pm. Then you can take Tylenol again at 9pm.   Take pain medications as directed for break through pain. Do not drive or operate machinery while taking this medication.   Keep the arm elevated to help with swelling.  Do not get the splint wet.  As we discussed, you will follow-up with orthopedic doctor.  He is planning to see you on Thursday at 9 AM.  Call his office to confirm that appointment.  Return the emergency department for any worsening pain, numbness, discoloration or any other worsening or concerning symptoms.

## 2020-04-30 NOTE — ED Provider Notes (Signed)
[facility] EMERGENCY DEPARTMENT Provider Note   CSN: [id number] Arrival date & time: 04/30/20  1141     History Chief Complaint  Patient presents with  . Hand Injury    Andrea Whitehead is a 24 y.o. female possible history of ADHD, asthma, bipolar who presents for evaluation of left hand pain.  She reports that she was playing basketball yesterday and stated that she landed wrong.  She tried to catch her self but states that her fist hit the ground.  Since then, she has had pain particularly to the fourth and fifth digits of the left hand.  Reports some associated swelling.  Pain is worse when she attempts to move.  She is use over-the-counter Tylenol and ibuprofen with minimal improvement.  She denies any numbness/weakness.  The history is provided by the patient.       Past Medical History:  Diagnosis Date  . ADHD (attention deficit hyperactivity disorder)   . Asthma   . Bipolar disorder (HCC)   . Oppositional defiant disorder   . Vision abnormalities     Patient Active Problem List   Diagnosis Date Noted  . 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 Episodic mood disorder (HCC) 12/07/2012  . ODD (oppositional defiant disorder) 12/07/2012  . ADHD (attention deficit hyperactivity disorder) 05/27/2012    Past Surgical History:  Procedure Laterality Date  . NO PAST SURGERIES    . WRIST SURGERY Right      OB History    Gravida  1   Para  1   Term  1   Preterm      AB      Living  1     SAB      TAB      Ectopic      Multiple  0   Live Births  1           Family History  Problem Relation Age of Onset  . Hypertension Father   . ADD / ADHD Mother   . Bipolar disorder Mother   . ADD / ADHD Brother   . Bipolar disorder Maternal Grandmother   . Diabetes Maternal Aunt     Social History   Tobacco Use  . Smoking status: Current Every Day Smoker    Packs/day: 0.25    Types: Cigarettes  . Smokeless  tobacco: Never Used  Vaping Use  . Vaping Use: Never used  Substance Use Topics  . Alcohol use: No  . Drug use: No    Home Medications Prior to Admission medications   Medication Sig Start Date End Date Taking? Authorizing Provider  HYDROcodone-acetaminophen (NORCO/VICODIN) 5-325 MG tablet Take 1-2 tablets by mouth every 6 (six) hours as needed. 04/30/20   Maxwell Caul, PA-C    Allergies    Patient has no known allergies.  Review of Systems   Review of Systems  Musculoskeletal:       Left hand pain  Neurological: Negative for weakness and numbness.  All other systems reviewed and are negative.   Physical Exam Updated Vital Signs BP 119/74 (BP Location: Right Arm)   Pulse 78   Temp 98 F (36.7 C) (Oral)   Resp 16   Ht 5\' 5"  (1.651 m)   Wt 106.6 kg   LMP 04/11/2020   SpO2 100%   BMI 39.11 kg/m   Physical Exam Vitals and nursing note reviewed.  Constitutional:      Appearance: She is well-developed.  HENT:     Head: Normocephalic and atraumatic.  Eyes:     General: No scleral icterus.       Right eye: No discharge.        Left eye: No discharge.     Conjunctiva/sclera: Conjunctivae normal.  Cardiovascular:     Pulses:          Radial pulses are 2+ on the right side and 2+ on the left side.  Pulmonary:     Effort: Pulmonary effort is normal.  Musculoskeletal:     Comments: Tenderness palpation noted to the ulnar aspect of the left hand overlying the fourth and fifth metacarpal and fourth and fifth digits.  There is some overlying swelling noted dorsal aspect of the left hand.  No deformity or crepitus noted.  No rotational deformity noted.  She can flex and extend all 5 digits of her finger but does report pain with doing so.  No bony tenderness noted to left forearm, left wrist, left elbow, left shoulder.  No tenderness palpation noted to right upper extremity.  Skin:    General: Skin is warm and dry.     Comments: Good distal cap refill.  RUE is not dusky  in appearance or cool to touch.  Neurological:     Mental Status: She is alert.     Comments: Sensation intact along major nerve distributions of BUE  Psychiatric:        Speech: Speech normal.        Behavior: Behavior normal.     ED Results / Procedures / Treatments   Labs (all labs ordered are listed, but only abnormal results are displayed) Labs Reviewed - No data to display  EKG None  Radiology DG Hand Complete Left  Result Date: 04/30/2020 CLINICAL DATA:  Injury to left hand. EXAM: LEFT HAND - COMPLETE 3+ VIEW COMPARISON:  None. FINDINGS: There is a comminuted minimally displaced oblique fracture of the distal fifth metacarpal bone without intra-articular extension. Mild wall angulation of the distal fracture fragment. Associated soft tissue swelling. IMPRESSION: Comminuted minimally displaced oblique fracture of the distal fifth metacarpal bone. Electronically Signed   By: Ted Mcalpine M.D.   On: 04/30/2020 12:25    Procedures Procedures (including critical care time)  Medications Ordered in ED Medications - No data to display  ED Course  I have reviewed the triage vital signs and the nursing notes.  Pertinent labs & imaging results that were available during my care of the patient were reviewed by me and considered in my medical decision making (see chart for details).    MDM Rules/Calculators/A&P                          24 year old female who presents for evaluation of left hand pain status post mechanical fall that occurred yesterday.  She was trying to get her cell from breast fall and hit the pavement with her fist slammed against the ground.  No head injury, LOC.  On initially arrival, she is afebrile nontoxic-appearing.  Vital signs are stable.  She is neurovascularly intact.  Compartments are soft.  On exam, she has tenderness palpation on the left ulnar aspect of her hand with overlying soft tissue swelling.  No rotational deformity noted.  Concern for  fracture versus dislocation versus sprain.  X-rays ordered at triage.  X-ray shows comminuted minimally displaced oblique fracture of the distal fifth metacarpal bone.  Discussed patient with Dr. Amanda Pea (hand) who  agrees with plan for ulnar gutter splint. Will follow up with patient in the outpatient office.   Updated patient on plan.  Patient reviewed on PMP will give short course of pain medication for acute pain.   Reevaluation after splint placement.  Patient with good distal sensation and cap refill.  Patient instructed follow-up with outpatient hand as directed. At this time, patient exhibits no emergent life-threatening condition that require further evaluation in ED or admission. Patient had ample opportunity for questions and discussion. All patient's questions were answered with full understanding. Strict return precautions discussed. Patient expresses understanding and agreement to plan.   Portions of this note were generated with Scientist, clinical (histocompatibility and immunogenetics). Dictation errors may occur despite best attempts at proofreading.  Final Clinical Impression(s) / ED Diagnoses Final diagnoses:  Closed displaced fracture of neck of fifth metacarpal bone of left hand, initial encounter    Rx / DC Orders ED Discharge Orders         Ordered    HYDROcodone-acetaminophen (NORCO/VICODIN) 5-325 MG tablet  Every 6 hours PRN     Discontinue  Reprint     04/30/20 1316           Maxwell Caul, PA-C 04/30/20 1428    Sabas Sous, MD 04/30/20 1552

## 2020-12-18 NOTE — Progress Notes (Signed)
Opened in error - please disregard

## 2023-04-17 ENCOUNTER — Emergency Department (HOSPITAL_BASED_OUTPATIENT_CLINIC_OR_DEPARTMENT_OTHER)
Admission: EM | Admit: 2023-04-17 | Discharge: 2023-04-17 | Disposition: A | Discharge status: Home / Self Care | Payer: Medicaid Other | Attending: Emergency Medicine | Admitting: Emergency Medicine

## 2023-04-17 ENCOUNTER — Other Ambulatory Visit: Payer: Self-pay

## 2023-04-17 ENCOUNTER — Encounter (HOSPITAL_BASED_OUTPATIENT_CLINIC_OR_DEPARTMENT_OTHER): Payer: Self-pay | Admitting: Emergency Medicine

## 2023-04-17 DIAGNOSIS — L819 Disorder of pigmentation, unspecified: Secondary | ICD-10-CM | POA: Insufficient documentation

## 2023-04-17 DIAGNOSIS — L989 Disorder of the skin and subcutaneous tissue, unspecified: Secondary | ICD-10-CM

## 2023-04-17 MED ORDER — TRIAMCINOLONE ACETONIDE 0.1 % EX CREA: TOPICAL_CREAM | Freq: Once | CUTANEOUS | Status: DC

## 2023-04-17 MED ORDER — SULFAMETHOXAZOLE-TRIMETHOPRIM 800-160 MG PO TABS: 1.0000 | ORAL_TABLET | Freq: Two times a day (BID) | ORAL | 0 refills | Status: AC

## 2023-04-17 MED ORDER — HYDROCORTISONE 1 % EX CREA
TOPICAL_CREAM | Freq: Once | CUTANEOUS | Status: AC
  Filled 2023-04-17: qty 28

## 2023-04-17 MED ORDER — TRIAMCINOLONE ACETONIDE 0.1 % EX CREA: 1.0000 | TOPICAL_CREAM | Freq: Two times a day (BID) | CUTANEOUS | 0 refills | Status: AC

## 2023-04-17 MED ORDER — SULFAMETHOXAZOLE-TRIMETHOPRIM 800-160 MG PO TABS
1.0000 | ORAL_TABLET | Freq: Once | ORAL | Status: AC
  Administered 2023-04-17: 1 via ORAL
  Filled 2023-04-17: qty 1

## 2023-04-17 NOTE — ED Provider Notes (Signed)
[place] EMERGENCY DEPARTMENT AT [facility] Provider Note   CSN: [id number] Arrival date & time: 04/17/23  1938     History  Chief Complaint  Patient presents with   Wound Check    Andrea Whitehead is a 27 y.o. female with no significant PMH who presents with a tattoo to left hand (new x 1 wk) is not healing well.  She had a purple rose tattooed on the dorsal side of her left hand and states that it looks like entirely different tattoo now.  She states the purple color is gone and in the areas where it was purple they are flaky red and painful.  Reports clear drainage as well.  Has never had that purple color tattooed before..  She denies any diffuse redness or swelling of the hand.  No fevers chills.   Wound Check       Home Medications Prior to Admission medications   Medication Sig Start Date End Date Taking? Authorizing Provider  sulfamethoxazole-trimethoprim (BACTRIM DS) 800-160 MG tablet Take 1 tablet by mouth 2 (two) times daily for 7 days. 04/17/23 04/24/23 Yes Loetta Rough, MD  triamcinolone cream (KENALOG) 0.1 % Apply 1 Application topically 2 (two) times daily. 04/17/23  Yes Loetta Rough, MD  HYDROcodone-acetaminophen (NORCO/VICODIN) 5-325 MG tablet Take 1-2 tablets by mouth every 6 (six) hours as needed. 04/30/20   Maxwell Caul, PA-C      Allergies    Patient has no known allergies.    Review of Systems   Review of Systems A 10 point review of systems was performed and is negative unless otherwise reported in HPI.  Physical Exam Updated Vital Signs BP (!) 142/65 (BP Location: Right Arm)   Pulse 98   Temp 98.9 F (37.2 C) (Oral)   Resp 20   Ht 5\' 5"  (1.651 m)   Wt 104.3 kg   LMP 04/09/2023   SpO2 95%   BMI 38.27 kg/m  Physical Exam General: Normal appearing female, sitting in bed.  HEENT: Sclera anicteric, MMM, trachea midline.  Cardiology: RRR, no murmurs/rubs/gallops.  Resp: Normal respiratory rate and effort.  Abd: Soft,  non-distended.  GU: Deferred. MSK: No peripheral edema or signs of trauma. Skin: warm, dry. Rose tattoo on L hand with raised, flaky, TTP areas and some vesicular lesions, ulcerations. No significant erythema, induration, fluctuance. No crepitus to palpation. No purple pigment noted on rose.  Please see image.  Neuro: A&Ox4, CNs II-XII grossly intact. MAEs. Sensation grossly intact.  Psych: Normal mood and affect.        ED Results / Procedures / Treatments   Labs (all labs ordered are listed, but only abnormal results are displayed) Labs Reviewed - No data to display  EKG None  Radiology No results found.  Procedures Procedures    Medications Ordered in ED Medications  sulfamethoxazole-trimethoprim (BACTRIM DS) 800-160 MG per tablet 1 tablet (has no administration in time range)  triamcinolone cream (KENALOG) 0.1 % cream (has no administration in time range)    ED Course/ Medical Decision Making/ A&P                          Medical Decision Making Risk Prescription drug management.    MDM:    For tattoo-related complications, consider cellulitis or abscess, allergic contact dermatitis related to tattoo pigment.  Based on physical exam and clinical status, no concern for mycobacterial infection, necrotizing infection, photosensitivity, lichenification.   She  has no significant erythema, induration, fluctuance to indicate cellulitis or abscess, and I believe that likely her symptoms are due to an allergic contact dermatitis to the purple pigment in the tattoo.  However I cannot completely rule out cellulitis and will treat with both triamcinolone ointment twice a day as well as Bactrim x 7 days.  Instructed patient to follow-up with dermatology as well as PCP.  She is very well-appearing and hemodynamically stable.  Given discharge instructions and return precautions, all questions answered to patient satisfaction.     Additional history obtained from chart review.     Social Determinants of Health: Patient lives independently with her son  Disposition:  DC  Co morbidities that complicate the patient evaluation  Past Medical History:  Diagnosis Date   ADHD (attention deficit hyperactivity disorder)    Asthma    Bipolar disorder (HCC)    Oppositional defiant disorder    Vision abnormalities      Medicines Meds ordered this encounter  Medications   sulfamethoxazole-trimethoprim (BACTRIM DS) 800-160 MG per tablet 1 tablet   sulfamethoxazole-trimethoprim (BACTRIM DS) 800-160 MG tablet    Sig: Take 1 tablet by mouth 2 (two) times daily for 7 days.    Dispense:  14 tablet    Refill:  0   triamcinolone cream (KENALOG) 0.1 %    Sig: Apply 1 Application topically 2 (two) times daily.    Dispense:  30 g    Refill:  0   triamcinolone cream (KENALOG) 0.1 % cream    I have reviewed the patients home medicines and have made adjustments as needed  Problem List / ED Course: Problem List Items Addressed This Visit   None Visit Diagnoses     Disorder of skin due to tattoo ink    -  Primary                   This note was created using dictation software, which may contain spelling or grammatical errors.    Loetta Rough, MD 04/17/23 2133

## 2023-04-17 NOTE — ED Triage Notes (Signed)
Pt reports tattoo to left hand (new x 1 wk) is not healing well; reports drainage and pain to hand

## 2023-04-17 NOTE — Discharge Instructions (Signed)
Thank you for coming to [facility] Emergency Department. You were seen for pain with your tattoo. We did an exam, and this showed likely an allergic reaction of your skin to the purple pigment in your tattoo.  This is called allergic dermatitis.  This is treated with topical triamcinolone cream applied twice daily.  It is also possible that you are experiencing infection related to the tattoo.  For this we will treat you with Bactrim 1 tablet by mouth twice a day for 7 days.  Please contact a dermatologist on Monday morning to make a follow-up appointment.  Please follow up with your primary care provider within 1 week.   Do not hesitate to return to the ED or call 911 if you experience: -Worsening symptoms -Worsening redness, swelling, pain of the hand -Lightheadedness, passing out -Fevers/chills -Anything else that concerns you
